# Patient Record
Sex: Male | Born: 1935 | Race: White | Hispanic: No | Marital: Married | Smoking: Never smoker
Health system: Southern US, Community
[De-identification: ages and names within clinical notes are randomized; demographics above are authoritative.]

## PROBLEM LIST (undated history)

## (undated) DIAGNOSIS — R55 Syncope and collapse: Secondary | ICD-10-CM

## (undated) DIAGNOSIS — F0390 Unspecified dementia without behavioral disturbance: Secondary | ICD-10-CM

## (undated) DIAGNOSIS — Z87442 Personal history of urinary calculi: Secondary | ICD-10-CM

## (undated) DIAGNOSIS — C642 Malignant neoplasm of left kidney, except renal pelvis: Secondary | ICD-10-CM

## (undated) DIAGNOSIS — R112 Nausea with vomiting, unspecified: Secondary | ICD-10-CM

## (undated) DIAGNOSIS — N400 Enlarged prostate without lower urinary tract symptoms: Secondary | ICD-10-CM

## (undated) DIAGNOSIS — I499 Cardiac arrhythmia, unspecified: Secondary | ICD-10-CM

## (undated) DIAGNOSIS — Z96659 Presence of unspecified artificial knee joint: Secondary | ICD-10-CM

## (undated) DIAGNOSIS — I10 Essential (primary) hypertension: Secondary | ICD-10-CM

## (undated) DIAGNOSIS — E785 Hyperlipidemia, unspecified: Secondary | ICD-10-CM

## (undated) DIAGNOSIS — F0280 Dementia in other diseases classified elsewhere without behavioral disturbance: Secondary | ICD-10-CM

## (undated) DIAGNOSIS — G3184 Mild cognitive impairment, so stated: Secondary | ICD-10-CM

## (undated) DIAGNOSIS — Z9889 Other specified postprocedural states: Secondary | ICD-10-CM

## (undated) DIAGNOSIS — M1990 Unspecified osteoarthritis, unspecified site: Secondary | ICD-10-CM

## (undated) HISTORY — PX: CHOLECYSTECTOMY: SHX55

## (undated) HISTORY — PX: BOWEL RESECTION: SHX1257

## (undated) HISTORY — PX: COLONOSCOPY: SHX174

## (undated) HISTORY — DX: Cardiac arrhythmia, unspecified: I49.9

## (undated) HISTORY — DX: Hereditary hemochromatosis: E83.110

## (undated) HISTORY — PX: SKIN LESION EXCISION: SHX2412

## (undated) HISTORY — PX: APPENDECTOMY: SHX54

## (undated) HISTORY — DX: Benign prostatic hyperplasia without lower urinary tract symptoms: N40.0

## (undated) HISTORY — PX: OTHER SURGICAL HISTORY: SHX169

## (undated) HISTORY — DX: Dementia in other diseases classified elsewhere, unspecified severity, without behavioral disturbance, psychotic disturbance, mood disturbance, and anxiety: F02.80

## (undated) HISTORY — DX: Hyperlipidemia, unspecified: E78.5

---
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
## 2003-12-23 ENCOUNTER — Ambulatory Visit: Payer: Self-pay | Admitting: Family Medicine

## 2004-03-31 ENCOUNTER — Ambulatory Visit: Payer: Self-pay | Admitting: Family Medicine

## 2004-06-04 ENCOUNTER — Ambulatory Visit: Payer: Self-pay | Admitting: Family Medicine

## 2004-12-01 ENCOUNTER — Ambulatory Visit: Payer: Self-pay | Admitting: Family Medicine

## 2012-10-03 DIAGNOSIS — G3184 Mild cognitive impairment, so stated: Secondary | ICD-10-CM

## 2012-10-03 HISTORY — DX: Mild cognitive impairment of uncertain or unknown etiology: G31.84

## 2015-03-11 DIAGNOSIS — R55 Syncope and collapse: Secondary | ICD-10-CM

## 2015-03-11 DIAGNOSIS — I10 Essential (primary) hypertension: Secondary | ICD-10-CM

## 2015-03-11 HISTORY — DX: Essential (primary) hypertension: I10

## 2015-03-11 HISTORY — DX: Syncope and collapse: R55

## 2015-11-07 ENCOUNTER — Ambulatory Visit (HOSPITAL_COMMUNITY)
Admission: RE | Admit: 2015-11-07 | Discharge: 2015-11-07 | Disposition: A | Discharge status: Home / Self Care | Payer: Medicare HMO | Source: Ambulatory Visit | Attending: Orthopedic Surgery | Admitting: Orthopedic Surgery

## 2015-11-07 ENCOUNTER — Encounter (HOSPITAL_COMMUNITY): Payer: Self-pay

## 2015-11-07 ENCOUNTER — Encounter (HOSPITAL_COMMUNITY)
Admission: RE | Admit: 2015-11-07 | Discharge: 2015-11-07 | Disposition: A | Discharge status: Home / Self Care | Payer: Medicare HMO | Source: Ambulatory Visit | Attending: Orthopedic Surgery | Admitting: Orthopedic Surgery

## 2015-11-07 DIAGNOSIS — M1712 Unilateral primary osteoarthritis, left knee: Secondary | ICD-10-CM | POA: Diagnosis not present

## 2015-11-07 DIAGNOSIS — Z01818 Encounter for other preprocedural examination: Secondary | ICD-10-CM | POA: Insufficient documentation

## 2015-11-07 DIAGNOSIS — R001 Bradycardia, unspecified: Secondary | ICD-10-CM | POA: Insufficient documentation

## 2015-11-07 DIAGNOSIS — Z0183 Encounter for blood typing: Secondary | ICD-10-CM | POA: Diagnosis not present

## 2015-11-07 DIAGNOSIS — R9431 Abnormal electrocardiogram [ECG] [EKG]: Secondary | ICD-10-CM | POA: Diagnosis not present

## 2015-11-07 DIAGNOSIS — Z01812 Encounter for preprocedural laboratory examination: Secondary | ICD-10-CM | POA: Insufficient documentation

## 2015-11-07 HISTORY — DX: Personal history of urinary calculi: Z87.442

## 2015-11-07 HISTORY — DX: Unspecified osteoarthritis, unspecified site: M19.90

## 2015-11-07 HISTORY — DX: Other specified postprocedural states: Z98.890

## 2015-11-07 HISTORY — DX: Unspecified dementia, unspecified severity, without behavioral disturbance, psychotic disturbance, mood disturbance, and anxiety: F03.90

## 2015-11-07 HISTORY — DX: Essential (primary) hypertension: I10

## 2015-11-07 HISTORY — DX: Nausea with vomiting, unspecified: R11.2

## 2015-11-07 LAB — CBC WITH DIFFERENTIAL/PLATELET
BASOS PCT: 1 %
Basophils Absolute: 0.1 10*3/uL (ref 0.0–0.1)
Eosinophils Absolute: 0.3 10*3/uL (ref 0.0–0.7)
Eosinophils Relative: 3 %
HEMATOCRIT: 44.2 % (ref 39.0–52.0)
Hemoglobin: 15 g/dL (ref 13.0–17.0)
Lymphocytes Relative: 20 %
Lymphs Abs: 2 10*3/uL (ref 0.7–4.0)
MCH: 29.6 pg (ref 26.0–34.0)
MCHC: 33.9 g/dL (ref 30.0–36.0)
MCV: 87.4 fL (ref 78.0–100.0)
MONO ABS: 0.7 10*3/uL (ref 0.1–1.0)
MONOS PCT: 7 %
NEUTROS ABS: 6.8 10*3/uL (ref 1.7–7.7)
Neutrophils Relative %: 69 %
Platelets: 269 10*3/uL (ref 150–400)
RBC: 5.06 MIL/uL (ref 4.22–5.81)
RDW: 13.8 % (ref 11.5–15.5)
WBC: 9.9 10*3/uL (ref 4.0–10.5)

## 2015-11-07 LAB — COMPREHENSIVE METABOLIC PANEL
ALBUMIN: 4.4 g/dL (ref 3.5–5.0)
ALT: 39 U/L (ref 17–63)
ANION GAP: 7 (ref 5–15)
AST: 28 U/L (ref 15–41)
Alkaline Phosphatase: 51 U/L (ref 38–126)
BILIRUBIN TOTAL: 0.9 mg/dL (ref 0.3–1.2)
BUN: 12 mg/dL (ref 6–20)
CO2: 26 mmol/L (ref 22–32)
Calcium: 9.3 mg/dL (ref 8.9–10.3)
Chloride: 105 mmol/L (ref 101–111)
Creatinine, Ser: 0.93 mg/dL (ref 0.61–1.24)
GLUCOSE: 104 mg/dL — AB (ref 65–99)
POTASSIUM: 4.4 mmol/L (ref 3.5–5.1)
Sodium: 138 mmol/L (ref 135–145)
TOTAL PROTEIN: 7.2 g/dL (ref 6.5–8.1)

## 2015-11-07 LAB — ABO/RH: ABO/RH(D): A POS

## 2015-11-07 LAB — APTT: aPTT: 30 seconds (ref 24–36)

## 2015-11-07 LAB — SURGICAL PCR SCREEN
MRSA, PCR: NEGATIVE
STAPHYLOCOCCUS AUREUS: NEGATIVE

## 2015-11-07 LAB — TYPE AND SCREEN
ABO/RH(D): A POS
Antibody Screen: NEGATIVE

## 2015-11-07 LAB — PROTIME-INR
INR: 1.04
PROTHROMBIN TIME: 13.6 s (ref 11.4–15.2)

## 2015-11-07 NOTE — H&P (Signed)
CHIEF COMPLAINT:  Painful knee.   HPI:  Javier Hall is as 80 year old white male who has a history of pain in his left knee for the past five or so years. He had a gradual onset of pain starting about five years ago which has gradually worsened over time. He had a previous arthroscopic debridement and meniscectomy in the past. This has been treated conservatively with over-the-counter NSAID's as well as activity modification. He rates his pain as about a 7/10. He has nighttime pain as well as pain with activities of daily living. He has tried Aleve with only mild improvement.  The corticosteroid injections also provided mild improvement. He has not had viscosupplementation. He is seen today for evaluation.  Past medical history: In general his health is good.   Hospitalizations:  1975 for colon resection for diverticulosis.  1980's for laparoscopic cholecystectomy. 2006 for left knee arthroscopic surgery.  2017 for hypertension.   Medications:  Namenda X-RAYS 28 mg daily  Donepezil 100 mg daily.  Lisinopril 10 mg daily. Aspirin 81 mg daily. Fish Oil 100 mg daily. Multi-Vitamin for Men one daily.   Review of systems: The 14 point review of systems reveals glasses and hoarseness. He has had hypertension starting in June 2017.  Prior to that he did not have any symptoms. He is on Lisinopril for that. He has had hemorrhoidal pain but this seems to be under control. Renal calculi noted in 1975. He does have nocturia 2-3 times an evening. 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 He also has some decreased memory. He is on Namenda and Donepezil.   Family History:   Mother deceased in her 53's; she had hypertension and breast cancer. Father died at 75 from an aneurysm. He did have hypertension and diabetes. Brothers age 6 and 35, one with prostate cancer. Two sisters ages 79 and 92, history of breast cancer with them.  Social  History: Mr. Disabatino is a 80 year old married male who is retired. Denies  history of tobacco or alcohol.   EXAMINATION: In general his health is good.  HT: 5\' 11"   WT: 188.7   P: 64   BP: 165/65 Oxygen Sat:  96%. Head: Normocephalic.  Eyes: PERRLA with Extraocular motion is intact. Ears, nose and throat:  Benign.  Neck:  Supple, no bruits noted.  Chest: Good expansion.  Lungs:  Essentially clear.  Cardiac:  He had a regular rhythm and rate. Normal S1 and S2. No murmur was appreciated.  Pulses:  2+ bilateral, symmetric in lower extremities.  Abdomen:  Scaphoid, soft and nontender. No masses palpable. Normal bowel sounds present. CNS: Oriented times three. Cranial nerves II-XII grossly intact.  Genital, rectal and breast exam: Not indicated for an orthopedic evaluation. Musculoskeletal:  Today, he has a range of motion of 5-115 degrees. He has marked crepitus with range of motion about the joint. Trace effusion. He does have some pseudolaxity with valgus stressing but has a good endpoint.   X-RAYS: Radiographic studies show a significant varus with good bone quality. He has severe degenerative joint disease with loss of the medial compartment.   IMPRESSION:  1. Endstage osteoarthritis left knee.  2. History of hypertension. 3. Prostatic hypertrophy.  4. Mild memory loss.  PLAN:  At this time, my plan would be to proceed with a left total knee arthroplasty.  At this time the patient history, physical exam and imaging studies are consistent with progressive degenerative changes of the left knee. He has failed conservative treatment.  The clearance notes were reviewed. After discussion with the patient it was felt a total knee arthroplasty was indicated. The procedure, risks and benefits of the total knee arthroplasty were presented and reviewed. The risks include but are not limited to aseptic loosening, infection, blood clots, vascular and nerve injury, stiffness, patella tracking problems and fracture complications among others discussed.  The patient  acknowledged the explanation and agreed to proceed with a total knee replacement on the left.    Javier Craze Saanya Zieske, PA-C  11/07/2015 12:12 PM

## 2015-11-07 NOTE — Progress Notes (Signed)
PCP is Dr Jenean Lindau- clearance noted on chart from Dr. Kurtis Bushman he saw a Heart dr while in the hospital in [place], but doesn't remember his name. His wife states his blood pressure went up and he spent 2 days in the hospital in [place].  Denies any chest pain. Denies ever having a card cath. Not sure,but he thinks he had a stress test and echo while in the hospital -request sent for any heart studies done in the hospital.  Care everywhere has report of Halter monitor.

## 2015-11-07 NOTE — Pre-Procedure Instructions (Signed)
Javier Hall  11/07/2015      [facility] - [address] [place] [address] Phone: [phone] Fax: [phone]    Your procedure is scheduled on Nov 8.  Report to [facility] Admitting at [phone] A.M.  Call this number if you have problems the morning of surgery:  [phone]   Remember:  Do not eat food or drink liquids after midnight.  Take these medicines the morning of surgery with A SIP OF WATER Donepezil (Aricept), Namenda XR  Stop taking aspirin, Ibuprofen, Advil, Motrin, BC's, Goody's, Herbal medications, Fish Oil, Aleve   Do not wear jewelry, make-up or nail polish.  Do not wear lotions, powders, or perfumes, or deoderant.  Do not shave 48 hours prior to surgery.  Men may shave face and neck.  Do not bring valuables to the hospital.  [facility] is not responsible for any belongings or valuables.  Contacts, dentures or bridgework may not be worn into surgery.  Leave your suitcase in the car.  After surgery it may be brought to your room.  For patients admitted to the hospital, discharge time will be determined by your treatment team.  Patients discharged the day of surgery will not be allowed to drive home.    Special instructions:  [place] - Preparing for Surgery  Before surgery, you can play an important role.  Because skin is not sterile, your skin needs to be as free of germs as possible.  You can reduce the number of germs on you skin by washing with CHG (chlorahexidine gluconate) soap before surgery.  CHG is an antiseptic cleaner which kills germs and bonds with the skin to continue killing germs even after washing.  Please DO NOT use if you have an allergy to CHG or antibacterial soaps.  If your skin becomes reddened/irritated stop using the CHG and inform your nurse when you arrive at Short Stay.  Do not shave (including legs and  underarms) for at least 48 hours prior to the first CHG shower.  You may shave your face.  Please follow these instructions carefully:   1.  Shower with CHG Soap the night before surgery and the    morning of Surgery.  2.  If you choose to wash your hair, wash your hair first as usual with your       normal shampoo.  3.  After you shampoo, rinse your hair and body thoroughly to remove the                      Shampoo.  4.  Use CHG as you would any other liquid soap.  You can apply chg directly       to the skin and wash gently with scrungie or a clean washcloth.  5.  Apply the CHG Soap to your body ONLY FROM THE NECK DOWN.        Do not use on open wounds or open sores.  Avoid contact with your eyes,       ears, mouth and genitals (private parts).  Wash genitals (private parts)       with your normal soap.  6.  Wash thoroughly, paying special attention to the area where your surgery        will be performed.  7.  Thoroughly rinse your body with warm water from the  neck down.  8.  DO NOT shower/wash with your normal soap after using and rinsing off       the CHG Soap.  9.  Pat yourself dry with a clean towel.            10.  Wear clean pajamas.            11.  Place clean sheets on your bed the night of your first shower and do not        sleep with pets.  Day of Surgery  Do not apply any lotions/deoderants the morning of surgery.  Please wear clean clothes to the hospital/surgery center.     Please read over the following fact sheets that you were given. Pain Booklet, Coughing and Deep Breathing, MRSA Information and Surgical Site Infection Prevention

## 2015-11-18 MED ORDER — TRANEXAMIC ACID 1000 MG/10ML IV SOLN
1000.0000 mg | INTRAVENOUS | Status: AC
  Administered 2015-11-19: 1000 mg via INTRAVENOUS
  Filled 2015-11-18: qty 10

## 2015-11-19 ENCOUNTER — Encounter (HOSPITAL_COMMUNITY): Payer: Self-pay | Admitting: Anesthesiology

## 2015-11-19 ENCOUNTER — Inpatient Hospital Stay (HOSPITAL_COMMUNITY): Payer: Medicare HMO | Admitting: Anesthesiology

## 2015-11-19 ENCOUNTER — Inpatient Hospital Stay (HOSPITAL_COMMUNITY): Payer: Medicare HMO

## 2015-11-19 ENCOUNTER — Inpatient Hospital Stay (HOSPITAL_COMMUNITY)
Admission: RE | Admit: 2015-11-19 | Discharge: 2015-11-21 | DRG: 470 | Disposition: A | Discharge status: Home Health | Payer: Medicare HMO | Source: Ambulatory Visit | Attending: Orthopedic Surgery | Admitting: Orthopedic Surgery

## 2015-11-19 ENCOUNTER — Encounter (HOSPITAL_COMMUNITY)
Admission: RE | Disposition: A | Discharge status: Home Health | Payer: Self-pay | Source: Ambulatory Visit | Attending: Orthopedic Surgery

## 2015-11-19 DIAGNOSIS — M25562 Pain in left knee: Secondary | ICD-10-CM | POA: Diagnosis present

## 2015-11-19 DIAGNOSIS — R262 Difficulty in walking, not elsewhere classified: Secondary | ICD-10-CM

## 2015-11-19 DIAGNOSIS — Z7982 Long term (current) use of aspirin: Secondary | ICD-10-CM | POA: Diagnosis not present

## 2015-11-19 DIAGNOSIS — Z96659 Presence of unspecified artificial knee joint: Secondary | ICD-10-CM

## 2015-11-19 DIAGNOSIS — F0390 Unspecified dementia without behavioral disturbance: Secondary | ICD-10-CM | POA: Diagnosis present

## 2015-11-19 DIAGNOSIS — I10 Essential (primary) hypertension: Secondary | ICD-10-CM | POA: Diagnosis present

## 2015-11-19 DIAGNOSIS — M25662 Stiffness of left knee, not elsewhere classified: Secondary | ICD-10-CM

## 2015-11-19 DIAGNOSIS — Z79899 Other long term (current) drug therapy: Secondary | ICD-10-CM

## 2015-11-19 DIAGNOSIS — M24562 Contracture, left knee: Secondary | ICD-10-CM | POA: Diagnosis not present

## 2015-11-19 DIAGNOSIS — N400 Enlarged prostate without lower urinary tract symptoms: Secondary | ICD-10-CM | POA: Diagnosis not present

## 2015-11-19 DIAGNOSIS — M1712 Unilateral primary osteoarthritis, left knee: Secondary | ICD-10-CM | POA: Diagnosis not present

## 2015-11-19 HISTORY — PX: TOTAL KNEE ARTHROPLASTY: SHX125

## 2015-11-19 HISTORY — DX: Presence of unspecified artificial knee joint: Z96.659

## 2015-11-19 SURGERY — ARTHROPLASTY, KNEE, TOTAL
Anesthesia: Monitor Anesthesia Care | Laterality: Left

## 2015-11-19 MED ORDER — HYDROMORPHONE HCL 2 MG/ML IJ SOLN: 0.5000 mg | INTRAMUSCULAR | Status: DC | PRN

## 2015-11-19 MED ORDER — CHLORHEXIDINE GLUCONATE 4 % EX LIQD: 60.0000 mL | Freq: Once | CUTANEOUS | Status: DC

## 2015-11-19 MED ORDER — FENTANYL CITRATE (PF) 100 MCG/2ML IJ SOLN
INTRAMUSCULAR | Status: AC
  Administered 2015-11-19: 25 ug via INTRAVENOUS
  Filled 2015-11-19: qty 2

## 2015-11-19 MED ORDER — ONDANSETRON HCL 4 MG/2ML IJ SOLN
INTRAMUSCULAR | Status: DC | PRN
  Administered 2015-11-19: 4 mg via INTRAVENOUS

## 2015-11-19 MED ORDER — SODIUM CHLORIDE 0.9 % IR SOLN
Status: DC | PRN
  Administered 2015-11-19: 3000 mL

## 2015-11-19 MED ORDER — BUPIVACAINE HCL 0.5 % IJ SOLN
INTRAMUSCULAR | Status: DC | PRN
  Administered 2015-11-19: 10 mL

## 2015-11-19 MED ORDER — ASPIRIN EC 325 MG PO TBEC: 325.0000 mg | DELAYED_RELEASE_TABLET | Freq: Every day | ORAL | 0 refills | Status: DC

## 2015-11-19 MED ORDER — ACETAMINOPHEN 10 MG/ML IV SOLN
INTRAVENOUS | Status: AC
  Filled 2015-11-19: qty 100

## 2015-11-19 MED ORDER — EPHEDRINE SULFATE 50 MG/ML IJ SOLN
INTRAMUSCULAR | Status: DC | PRN
  Administered 2015-11-19: 5 mg via INTRAVENOUS
  Administered 2015-11-19: 10 mg via INTRAVENOUS
  Administered 2015-11-19: 5 mg via INTRAVENOUS
  Administered 2015-11-19: 10 mg via INTRAVENOUS

## 2015-11-19 MED ORDER — ONDANSETRON HCL 4 MG PO TABS
4.0000 mg | ORAL_TABLET | Freq: Four times a day (QID) | ORAL | Status: DC | PRN
  Administered 2015-11-20: 4 mg via ORAL
  Filled 2015-11-19: qty 1

## 2015-11-19 MED ORDER — DOCUSATE SODIUM 100 MG PO CAPS
100.0000 mg | ORAL_CAPSULE | Freq: Two times a day (BID) | ORAL | Status: DC
  Administered 2015-11-19 – 2015-11-21 (×4): 100 mg via ORAL
  Filled 2015-11-19 (×4): qty 1

## 2015-11-19 MED ORDER — DIAZEPAM 2 MG PO TABS: 2.0000 mg | ORAL_TABLET | Freq: Three times a day (TID) | ORAL | 0 refills | Status: DC | PRN

## 2015-11-19 MED ORDER — SODIUM CHLORIDE 0.9 % IV SOLN: INTRAVENOUS | Status: DC

## 2015-11-19 MED ORDER — PROPOFOL 1000 MG/100ML IV EMUL
INTRAVENOUS | Status: AC
Start: 2015-11-19 — End: 2015-11-19
  Filled 2015-11-19: qty 100

## 2015-11-19 MED ORDER — ACETAMINOPHEN 325 MG PO TABS: 650.0000 mg | ORAL_TABLET | Freq: Four times a day (QID) | ORAL | Status: DC | PRN

## 2015-11-19 MED ORDER — DIPHENHYDRAMINE HCL 12.5 MG/5ML PO ELIX: 12.5000 mg | ORAL_SOLUTION | ORAL | Status: DC | PRN

## 2015-11-19 MED ORDER — DONEPEZIL HCL 10 MG PO TABS
10.0000 mg | ORAL_TABLET | Freq: Every day | ORAL | Status: DC
Start: 2015-11-20 — End: 2015-11-21
  Administered 2015-11-20 – 2015-11-21 (×2): 10 mg via ORAL
  Filled 2015-11-19 (×2): qty 1

## 2015-11-19 MED ORDER — LIDOCAINE 2% (20 MG/ML) 5 ML SYRINGE
INTRAMUSCULAR | Status: AC
  Filled 2015-11-19: qty 5

## 2015-11-19 MED ORDER — BISACODYL 5 MG PO TBEC: 5.0000 mg | DELAYED_RELEASE_TABLET | Freq: Every day | ORAL | Status: DC | PRN

## 2015-11-19 MED ORDER — ACETAMINOPHEN 650 MG RE SUPP: 650.0000 mg | Freq: Four times a day (QID) | RECTAL | Status: DC | PRN

## 2015-11-19 MED ORDER — PROPOFOL 500 MG/50ML IV EMUL
INTRAVENOUS | Status: AC
  Filled 2015-11-19: qty 50

## 2015-11-19 MED ORDER — CELECOXIB 200 MG PO CAPS
200.0000 mg | ORAL_CAPSULE | Freq: Two times a day (BID) | ORAL | Status: DC
  Administered 2015-11-19 – 2015-11-21 (×4): 200 mg via ORAL
  Filled 2015-11-19 (×4): qty 1

## 2015-11-19 MED ORDER — MEMANTINE HCL ER 28 MG PO CP24
28.0000 mg | ORAL_CAPSULE | Freq: Every day | ORAL | Status: DC
Start: 2015-11-20 — End: 2015-11-21
  Administered 2015-11-20 – 2015-11-21 (×2): 28 mg via ORAL
  Filled 2015-11-19 (×2): qty 1

## 2015-11-19 MED ORDER — POTASSIUM CHLORIDE IN NACL 20-0.9 MEQ/L-% IV SOLN
INTRAVENOUS | Status: DC
  Administered 2015-11-19 – 2015-11-20 (×2): via INTRAVENOUS
  Filled 2015-11-19 (×2): qty 1000

## 2015-11-19 MED ORDER — BUPIVACAINE LIPOSOME 1.3 % IJ SUSP
INTRAMUSCULAR | Status: DC | PRN
  Administered 2015-11-19: 20 mL

## 2015-11-19 MED ORDER — ASPIRIN EC 325 MG PO TBEC
325.0000 mg | DELAYED_RELEASE_TABLET | Freq: Every day | ORAL | Status: DC
  Administered 2015-11-20 – 2015-11-21 (×2): 325 mg via ORAL
  Filled 2015-11-19 (×2): qty 1

## 2015-11-19 MED ORDER — HYDROCODONE-ACETAMINOPHEN 7.5-325 MG PO TABS: 1.0000 | ORAL_TABLET | ORAL | 0 refills | Status: DC | PRN

## 2015-11-19 MED ORDER — ACETAMINOPHEN 10 MG/ML IV SOLN
INTRAVENOUS | Status: DC | PRN
  Administered 2015-11-19: 1000 mg via INTRAVENOUS

## 2015-11-19 MED ORDER — PROPOFOL 500 MG/50ML IV EMUL
INTRAVENOUS | Status: DC | PRN
  Administered 2015-11-19: 50 ug/kg/min via INTRAVENOUS

## 2015-11-19 MED ORDER — FENTANYL CITRATE (PF) 100 MCG/2ML IJ SOLN
INTRAMUSCULAR | Status: AC
  Filled 2015-11-19: qty 2

## 2015-11-19 MED ORDER — LACTATED RINGERS IV SOLN
INTRAVENOUS | Status: DC
Start: 2015-11-19 — End: 2015-11-19
  Administered 2015-11-19 (×3): via INTRAVENOUS

## 2015-11-19 MED ORDER — ONDANSETRON HCL 4 MG PO TABS: 4.0000 mg | ORAL_TABLET | Freq: Three times a day (TID) | ORAL | 0 refills | Status: DC | PRN

## 2015-11-19 MED ORDER — LISINOPRIL 10 MG PO TABS
10.0000 mg | ORAL_TABLET | Freq: Every day | ORAL | Status: DC
  Administered 2015-11-20 – 2015-11-21 (×2): 10 mg via ORAL
  Filled 2015-11-19 (×2): qty 1

## 2015-11-19 MED ORDER — PHENYLEPHRINE 40 MCG/ML (10ML) SYRINGE FOR IV PUSH (FOR BLOOD PRESSURE SUPPORT)
PREFILLED_SYRINGE | INTRAVENOUS | Status: AC
  Filled 2015-11-19: qty 10

## 2015-11-19 MED ORDER — OXYCODONE HCL 5 MG PO TABS
5.0000 mg | ORAL_TABLET | ORAL | Status: DC | PRN
  Administered 2015-11-19 – 2015-11-20 (×5): 10 mg via ORAL
  Administered 2015-11-21: 5 mg via ORAL
  Filled 2015-11-19 (×4): qty 2
  Filled 2015-11-19: qty 1
  Filled 2015-11-19: qty 2

## 2015-11-19 MED ORDER — FENTANYL CITRATE (PF) 100 MCG/2ML IJ SOLN
INTRAMUSCULAR | Status: DC | PRN
  Administered 2015-11-19: 50 ug via INTRAVENOUS

## 2015-11-19 MED ORDER — ZOLPIDEM TARTRATE 5 MG PO TABS: 5.0000 mg | ORAL_TABLET | Freq: Every evening | ORAL | Status: DC | PRN

## 2015-11-19 MED ORDER — PHENOL 1.4 % MT LIQD: 1.0000 | OROMUCOSAL | Status: DC | PRN

## 2015-11-19 MED ORDER — MAGNESIUM CITRATE PO SOLN: 1.0000 | Freq: Once | ORAL | Status: DC | PRN

## 2015-11-19 MED ORDER — CEFAZOLIN SODIUM-DEXTROSE 2-4 GM/100ML-% IV SOLN
INTRAVENOUS | Status: AC
  Filled 2015-11-19: qty 100

## 2015-11-19 MED ORDER — FENTANYL CITRATE (PF) 100 MCG/2ML IJ SOLN
25.0000 ug | INTRAMUSCULAR | Status: DC | PRN
  Administered 2015-11-19 (×2): 25 ug via INTRAVENOUS

## 2015-11-19 MED ORDER — ONDANSETRON HCL 4 MG/2ML IJ SOLN
4.0000 mg | Freq: Four times a day (QID) | INTRAMUSCULAR | Status: DC | PRN
  Administered 2015-11-20 – 2015-11-21 (×2): 4 mg via INTRAVENOUS
  Filled 2015-11-19 (×2): qty 2

## 2015-11-19 MED ORDER — OXYCODONE HCL 5 MG PO TABS
ORAL_TABLET | ORAL | Status: AC
  Filled 2015-11-19: qty 1

## 2015-11-19 MED ORDER — MEPERIDINE HCL 25 MG/ML IJ SOLN: 6.2500 mg | INTRAMUSCULAR | Status: DC | PRN

## 2015-11-19 MED ORDER — PHENYLEPHRINE HCL 10 MG/ML IJ SOLN
INTRAMUSCULAR | Status: DC | PRN
  Administered 2015-11-19 (×2): 80 ug via INTRAVENOUS

## 2015-11-19 MED ORDER — BUPIVACAINE HCL (PF) 0.5 % IJ SOLN
INTRAMUSCULAR | Status: AC
  Filled 2015-11-19: qty 30

## 2015-11-19 MED ORDER — BUPIVACAINE IN DEXTROSE 0.75-8.25 % IT SOLN
INTRATHECAL | Status: DC | PRN
  Administered 2015-11-19: 1.6 mL via INTRATHECAL

## 2015-11-19 MED ORDER — CEFAZOLIN SODIUM-DEXTROSE 2-4 GM/100ML-% IV SOLN
2.0000 g | Freq: Four times a day (QID) | INTRAVENOUS | Status: AC
  Administered 2015-11-19 (×2): 2 g via INTRAVENOUS
  Filled 2015-11-19 (×2): qty 100

## 2015-11-19 MED ORDER — BUPIVACAINE LIPOSOME 1.3 % IJ SUSP
20.0000 mL | Freq: Once | INTRAMUSCULAR | Status: DC
  Filled 2015-11-19: qty 20

## 2015-11-19 MED ORDER — METOCLOPRAMIDE HCL 5 MG PO TABS: 5.0000 mg | ORAL_TABLET | Freq: Three times a day (TID) | ORAL | Status: DC | PRN

## 2015-11-19 MED ORDER — PROPOFOL 10 MG/ML IV BOLUS
INTRAVENOUS | Status: DC | PRN
  Administered 2015-11-19: 30 mg via INTRAVENOUS

## 2015-11-19 MED ORDER — SODIUM CHLORIDE 0.9 % IJ SOLN
INTRAMUSCULAR | Status: DC | PRN
  Administered 2015-11-19: 40 mL via INTRAVENOUS

## 2015-11-19 MED ORDER — ALUM & MAG HYDROXIDE-SIMETH 200-200-20 MG/5ML PO SUSP: 30.0000 mL | ORAL | Status: DC | PRN

## 2015-11-19 MED ORDER — METOCLOPRAMIDE HCL 5 MG/ML IJ SOLN: 10.0000 mg | Freq: Once | INTRAMUSCULAR | Status: DC | PRN

## 2015-11-19 MED ORDER — HYDROMORPHONE HCL 1 MG/ML IJ SOLN: 0.5000 mg | INTRAMUSCULAR | Status: DC | PRN

## 2015-11-19 MED ORDER — EPHEDRINE 5 MG/ML INJ
INTRAVENOUS | Status: AC
  Filled 2015-11-19: qty 10

## 2015-11-19 MED ORDER — DEXAMETHASONE SODIUM PHOSPHATE 10 MG/ML IJ SOLN
10.0000 mg | Freq: Once | INTRAMUSCULAR | Status: AC
  Administered 2015-11-20: 10 mg via INTRAVENOUS
  Filled 2015-11-19: qty 1

## 2015-11-19 MED ORDER — METOCLOPRAMIDE HCL 5 MG/ML IJ SOLN: 5.0000 mg | Freq: Three times a day (TID) | INTRAMUSCULAR | Status: DC | PRN

## 2015-11-19 MED ORDER — CEFAZOLIN SODIUM-DEXTROSE 2-4 GM/100ML-% IV SOLN
2.0000 g | INTRAVENOUS | Status: AC
  Administered 2015-11-19: 2 g via INTRAVENOUS

## 2015-11-19 MED ORDER — MENTHOL 3 MG MT LOZG: 1.0000 | LOZENGE | OROMUCOSAL | Status: DC | PRN

## 2015-11-19 MED ORDER — POLYETHYLENE GLYCOL 3350 17 G PO PACK: 17.0000 g | PACK | Freq: Every day | ORAL | Status: DC | PRN

## 2015-11-19 SURGICAL SUPPLY — 72 items
APL SKNCLS STERI-STRIP NONHPOA (GAUZE/BANDAGES/DRESSINGS) ×1
BANDAGE ACE 4X5 VEL STRL LF (GAUZE/BANDAGES/DRESSINGS) ×3 IMPLANT
BANDAGE ACE 6X5 VEL STRL LF (GAUZE/BANDAGES/DRESSINGS) ×3 IMPLANT
BANDAGE ESMARK 6X9 LF (GAUZE/BANDAGES/DRESSINGS) ×1 IMPLANT
BENZOIN TINCTURE PRP APPL 2/3 (GAUZE/BANDAGES/DRESSINGS) ×3 IMPLANT
BLADE SAG 18X100X1.27 (BLADE) ×6 IMPLANT
BNDG CMPR 9X6 STRL LF SNTH (GAUZE/BANDAGES/DRESSINGS) ×1
BNDG ESMARK 6X9 LF (GAUZE/BANDAGES/DRESSINGS) ×3
BOWL SMART MIX CTS (DISPOSABLE) ×3 IMPLANT
CAPT KNEE TOTAL 3 ×2 IMPLANT
CATH FOLEY 2WAY  5CC 16FR SIL (CATHETERS) ×2
CATH FOLEY 2WAY 5CC 16FR SIL (CATHETERS) ×1 IMPLANT
CEMENT BONE SIMPLEX SPEEDSET (Cement) ×6 IMPLANT
CLOSURE WOUND 1/2 X4 (GAUZE/BANDAGES/DRESSINGS) ×2
COVER SURGICAL LIGHT HANDLE (MISCELLANEOUS) ×3 IMPLANT
CUFF TOURNIQUET SINGLE 34IN LL (TOURNIQUET CUFF) ×3 IMPLANT
DRAPE HALF SHEET 40X57 (DRAPES) ×3 IMPLANT
DRAPE IMP U-DRAPE 54X76 (DRAPES) ×3 IMPLANT
DRAPE PROXIMA HALF (DRAPES) ×3 IMPLANT
DRAPE U-SHAPE 47X51 STRL (DRAPES) ×3 IMPLANT
DRSG AQUACEL AG ADV 3.5X10 (GAUZE/BANDAGES/DRESSINGS) ×3 IMPLANT
DRSG PAD ABDOMINAL 8X10 ST (GAUZE/BANDAGES/DRESSINGS) ×3 IMPLANT
DURAPREP 26ML APPLICATOR (WOUND CARE) ×6 IMPLANT
ELECT CAUTERY BLADE 6.4 (BLADE) ×3 IMPLANT
ELECT REM PT RETURN 9FT ADLT (ELECTROSURGICAL) ×3
ELECTRODE REM PT RTRN 9FT ADLT (ELECTROSURGICAL) ×1 IMPLANT
EVACUATOR 1/8 PVC DRAIN (DRAIN) IMPLANT
FACESHIELD WRAPAROUND (MASK) ×6 IMPLANT
FACESHIELD WRAPAROUND OR TEAM (MASK) ×2 IMPLANT
GAUZE SPONGE 4X4 12PLY STRL (GAUZE/BANDAGES/DRESSINGS) ×3 IMPLANT
GLOVE BIOGEL PI IND STRL 7.0 (GLOVE) ×1 IMPLANT
GLOVE BIOGEL PI INDICATOR 7.0 (GLOVE) ×2
GLOVE ORTHO TXT STRL SZ7.5 (GLOVE) ×3 IMPLANT
GLOVE SURG ORTHO 7.0 STRL STRW (GLOVE) ×3 IMPLANT
GOWN STRL REUS W/ TWL LRG LVL3 (GOWN DISPOSABLE) ×2 IMPLANT
GOWN STRL REUS W/ TWL XL LVL3 (GOWN DISPOSABLE) ×1 IMPLANT
GOWN STRL REUS W/TWL LRG LVL3 (GOWN DISPOSABLE) ×6
GOWN STRL REUS W/TWL XL LVL3 (GOWN DISPOSABLE) ×3
HANDPIECE INTERPULSE COAX TIP (DISPOSABLE) ×3
IMMOBILIZER KNEE 22 UNIV (SOFTGOODS) ×3 IMPLANT
IMMOBILIZER KNEE 24 THIGH 36 (MISCELLANEOUS) IMPLANT
IMMOBILIZER KNEE 24 UNIV (MISCELLANEOUS)
KIT BASIN OR (CUSTOM PROCEDURE TRAY) ×3 IMPLANT
KIT ROOM TURNOVER OR (KITS) ×3 IMPLANT
MANIFOLD NEPTUNE II (INSTRUMENTS) ×3 IMPLANT
NEEDLE 18GX1X1/2 (RX/OR ONLY) (NEEDLE) ×3 IMPLANT
NEEDLE HYPO 25GX1X1/2 BEV (NEEDLE) ×3 IMPLANT
NS IRRIG 1000ML POUR BTL (IV SOLUTION) ×3 IMPLANT
PACK TOTAL JOINT (CUSTOM PROCEDURE TRAY) ×3 IMPLANT
PACK UNIVERSAL I (CUSTOM PROCEDURE TRAY) ×3 IMPLANT
PAD ARMBOARD 7.5X6 YLW CONV (MISCELLANEOUS) ×6 IMPLANT
PAD CAST 4YDX4 CTTN HI CHSV (CAST SUPPLIES) IMPLANT
PADDING CAST COTTON 4X4 STRL (CAST SUPPLIES) ×3
SET HNDPC FAN SPRY TIP SCT (DISPOSABLE) ×1 IMPLANT
STRIP CLOSURE SKIN 1/2X4 (GAUZE/BANDAGES/DRESSINGS) ×4 IMPLANT
SUCTION FRAZIER HANDLE 10FR (MISCELLANEOUS) ×2
SUCTION TUBE FRAZIER 10FR DISP (MISCELLANEOUS) ×1 IMPLANT
SUT MNCRL AB 4-0 PS2 18 (SUTURE) ×3 IMPLANT
SUT VIC AB 0 CT1 27 (SUTURE)
SUT VIC AB 0 CT1 27XBRD ANBCTR (SUTURE) IMPLANT
SUT VIC AB 1 CTX 27 (SUTURE) ×3 IMPLANT
SUT VIC AB 1 CTX 36 (SUTURE) ×6
SUT VIC AB 1 CTX36XBRD ANBCTR (SUTURE) ×2 IMPLANT
SUT VIC AB 2-0 CT1 27 (SUTURE) ×6
SUT VIC AB 2-0 CT1 TAPERPNT 27 (SUTURE) ×2 IMPLANT
SYR 50ML LL SCALE MARK (SYRINGE) ×3 IMPLANT
SYR CONTROL 10ML LL (SYRINGE) ×3 IMPLANT
TOWEL OR 17X24 6PK STRL BLUE (TOWEL DISPOSABLE) ×3 IMPLANT
TOWEL OR 17X26 10 PK STRL BLUE (TOWEL DISPOSABLE) ×3 IMPLANT
TRAY CATH 16FR W/PLASTIC CATH (SET/KITS/TRAYS/PACK) IMPLANT
WATER STERILE IRR 1000ML POUR (IV SOLUTION) ×3 IMPLANT
YANKAUER SUCT BULB TIP NO VENT (SUCTIONS) ×3 IMPLANT

## 2015-11-19 NOTE — Anesthesia Postprocedure Evaluation (Signed)
Anesthesia Post Note  Patient: Javier Hall  Procedure(s) Performed: Procedure(s) (LRB): TOTAL KNEE ARTHROPLASTY (Left)  Patient location during evaluation: PACU Anesthesia Type: MAC and Spinal Level of consciousness: awake Pain management: pain level controlled Vital Signs Assessment: post-procedure vital signs reviewed and stable Respiratory status: spontaneous breathing Cardiovascular status: stable Postop Assessment: spinal receding and no signs of nausea or vomiting Anesthetic complications: no    Last Vitals:  Vitals:   11/19/15 1654 11/19/15 1719  BP: (!) 146/75 (!) 149/63  Pulse: 74 74  Resp: 12 14  Temp:  36.4 C    Last Pain:  Vitals:   11/19/15 1732  TempSrc:   PainSc: 4                  Joziah Dollins

## 2015-11-19 NOTE — Progress Notes (Signed)
Orthopedic Tech Progress Note Patient Details:  Javier Hall Apr 13, 1935 [id number]  CPM Left Knee CPM Left Knee: On Left Knee Flexion (Degrees): 90 Left Knee Extension (Degrees): 0 Additional Comments: trapeze bar patient helper   Hildred Priest 11/19/2015, 4:20 PM Viewed order from doctor's order list

## 2015-11-19 NOTE — Discharge Summary (Addendum)
Patient ID: Javier Hall MRN: [id number] DOB/AGE: [birth year] 80 y.o.  Admit date: 11/19/2015 Discharge date: 11/21/2015  Admission Diagnoses:  Active Problems:   S/P total knee replacement   Discharge Diagnoses:  Same  Past Medical History:  Diagnosis Date  . Arthritis   . Dementia    mild  . History of kidney stones   . Hypertension   . PONV (postoperative nausea and vomiting)     Surgeries: Procedure(s): TOTAL KNEE ARTHROPLASTY on 11/19/2015   Consultants:   Discharged Condition: Improved  Hospital Course: ENNIS WITKOWSKI is an 80 y.o. male who was admitted 11/19/2015 for operative treatment of primary localized osteoarthritis left knee. Patient has severe unremitting pain that affects sleep, daily activities, and work/hobbies. After pre-op clearance the patient was taken to the operating room on 11/19/2015 and underwent  Procedure(s): TOTAL KNEE ARTHROPLASTY.    Patient was given perioperative antibiotics:  Anti-infectives    Start     Dose/Rate Route Frequency Ordered Stop   11/19/15 1715  ceFAZolin (ANCEF) IVPB 2g/100 mL premix     2 g 200 mL/hr over 30 Minutes Intravenous Every 6 hours 11/19/15 1709 11/19/15 2344   11/19/15 1230  ceFAZolin (ANCEF) IVPB 2g/100 mL premix     2 g 200 mL/hr over 30 Minutes Intravenous On call to O.R. 11/19/15 1031 11/19/15 1344   11/19/15 1032  ceFAZolin (ANCEF) 2-4 GM/100ML-% IVPB    Comments:  Merryl Hacker   : cabinet override      11/19/15 1032 11/19/15 1344       Patient was given sequential compression devices, early ambulation, and chemoprophylaxis to prevent DVT.  Patient benefited maximally from hospital stay and there were no complications.    Recent vital signs:  Patient Vitals for the past 24 hrs:  BP Temp Temp src Pulse Resp SpO2  11/21/15 0516 (!) 157/62 97.6 F (36.4 C) Oral 70 18 95 %  11/20/15 2033 (!) 156/65 98.1 F (36.7 C) Oral 93 18 96 %  11/20/15 1415 (!) 153/61 97.8 F (36.6 C) Oral 64 - 95 %      Recent laboratory studies:   Recent Labs  11/20/15 0540 11/21/15 0217  WBC 12.8* 23.7*  HGB 12.3* 12.7*  HCT 36.6* 36.8*  PLT 194 215  NA 136 136  K 4.2 4.6  CL 107 105  CO2 24 24  BUN 15 14  CREATININE 1.00 1.08  GLUCOSE 159* 152*  CALCIUM 8.1* 8.8*     Discharge Medications:     Medication List    STOP taking these medications   Fish Oil 1000 MG Caps   ibuprofen 200 MG tablet Commonly known as:  ADVIL,MOTRIN     TAKE these medications   aspirin EC 325 MG tablet Take 1 tablet (325 mg total) by mouth daily. 1 tab a day for the next 30 days to prevent blood clots What changed:  medication strength  how much to take  additional instructions   diazepam 2 MG tablet Commonly known as:  VALIUM Take 1 tablet (2 mg total) by mouth every 8 (eight) hours as needed for muscle spasms.   diphenhydrAMINE 25 MG tablet Commonly known as:  BENADRYL Take 25 mg by mouth 2 (two) times daily as needed for allergies.   donepezil 10 MG tablet Commonly known as:  ARICEPT Take 10 mg by mouth daily.   HYDROcodone-acetaminophen 7.5-325 MG tablet Commonly known as:  NORCO Take 1-2 tablets by mouth every 4 (four) hours as needed for  moderate pain.   lisinopril 10 MG tablet Commonly known as:  PRINIVIL,ZESTRIL Take 10 mg by mouth daily.   multivitamin with minerals Tabs tablet Take 1 tablet by mouth daily.   NAMENDA XR 28 MG Cp24 24 hr capsule Generic drug:  memantine Take 28 mg by mouth daily.   ondansetron 4 MG tablet Commonly known as:  ZOFRAN Take 1 tablet (4 mg total) by mouth every 8 (eight) hours as needed for nausea or vomiting.       Diagnostic Studies: Dg Chest 2 View  Result Date: 11/07/2015 CLINICAL DATA:  Preop for knee surgery. EXAM: CHEST  2 VIEW COMPARISON:  Radiograph of February 18, 2015. FINDINGS: The heart size and mediastinal contours are within normal limits. Both lungs are clear. No pneumothorax or pleural effusion is noted. The  visualized skeletal structures are unremarkable. IMPRESSION: No active cardiopulmonary disease. Electronically Signed   By: Marijo Conception, M.D.   On: 11/07/2015 14:41   Dg Knee Left Port  Result Date: 11/19/2015 CLINICAL DATA:  Total knee replacement EXAM: PORTABLE LEFT KNEE - 1-2 VIEW COMPARISON:  None. FINDINGS: Postop total knee replacement. Normal alignment. No fracture or immediate complication. IMPRESSION: Satisfactory total knee replacement. Electronically Signed   By: Franchot Gallo M.D.   On: 11/19/2015 16:18    Disposition: Final discharge disposition not confirmed    Follow-up Information    Ninetta Lights, MD. Schedule an appointment as soon as possible for a visit in 2 week(s).   Specialty:  Orthopedic Surgery Contact information: [address] [address] [phone]            Signed: Fannie Knee 11/21/2015, 10:07 AM

## 2015-11-19 NOTE — Transfer of Care (Signed)
Immediate Anesthesia Transfer of Care Note  Patient: Javier Hall  Procedure(s) Performed: Procedure(s): TOTAL KNEE ARTHROPLASTY (Left)  Patient Location: PACU  Anesthesia Type:MAC and Spinal  Level of Consciousness: awake, alert  and oriented  Airway & Oxygen Therapy: Patient Spontanous Breathing and Patient connected to face mask oxygen  Post-op Assessment: Report given to RN and Post -op Vital signs reviewed and stable  Post vital signs: Reviewed and stable  Last Vitals:  Vitals:   11/19/15 1026  BP: (!) 173/71  Pulse: (!) 59  Resp: 20  Temp: 36.7 C    Last Pain:  Vitals:   11/19/15 1026  TempSrc: Oral         Complications: No apparent anesthesia complications

## 2015-11-19 NOTE — Anesthesia Procedure Notes (Signed)
Spinal  Patient location during procedure: OR Staffing Anesthesiologist: Montez Hageman Performed: anesthesiologist  Preanesthetic Checklist Completed: patient identified, site marked, surgical consent, pre-op evaluation, timeout performed, IV checked, risks and benefits discussed and monitors and equipment checked Spinal Block Patient position: sitting Prep: Betadine Patient monitoring: heart rate, continuous pulse ox and blood pressure Approach: midline Location: L4-5 Injection technique: single-shot Needle Needle type: Sprotte  Needle gauge: 24 G Needle length: 9 cm Additional Notes Expiration date of kit checked and confirmed. Patient tolerated procedure well, without complications.

## 2015-11-19 NOTE — Anesthesia Preprocedure Evaluation (Addendum)
Anesthesia Evaluation  Patient identified by MRN, date of birth, ID band Patient awake    Reviewed: Allergy & Precautions, NPO status , Patient's Chart, lab work & pertinent test results  History of Anesthesia Complications (+) PONV  Airway Mallampati: II  TM Distance: >3 FB Neck ROM: Full    Dental no notable dental hx.    Pulmonary neg pulmonary ROS,    Pulmonary exam normal breath sounds clear to auscultation       Cardiovascular hypertension, Pt. on medications Normal cardiovascular exam Rhythm:Regular Rate:Normal     Neuro/Psych negative neurological ROS  negative psych ROS   GI/Hepatic negative GI ROS, Neg liver ROS,   Endo/Other  negative endocrine ROS  Renal/GU negative Renal ROS  negative genitourinary   Musculoskeletal negative musculoskeletal ROS (+)   Abdominal   Peds negative pediatric ROS (+)  Hematology negative hematology ROS (+)   Anesthesia Other Findings   Reproductive/Obstetrics negative OB ROS                             Anesthesia Physical Anesthesia Plan  ASA: II  Anesthesia Plan: Spinal   Post-op Pain Management:    Induction: Intravenous  Airway Management Planned: Simple Face Mask  Additional Equipment:   Intra-op Plan:   Post-operative Plan: Extubation in OR  Informed Consent: I have reviewed the patients History and Physical, chart, labs and discussed the procedure including the risks, benefits and alternatives for the proposed anesthesia with the patient or authorized representative who has indicated his/her understanding and acceptance.   Dental advisory given  Plan Discussed with: CRNA  Anesthesia Plan Comments:        Anesthesia Quick Evaluation

## 2015-11-19 NOTE — Interval H&P Note (Signed)
History and Physical Interval Note:  11/19/2015 10:53 AM  Javier Hall  has presented today for surgery, with the diagnosis of djd left knee  The various methods of treatment have been discussed with the patient and family. After consideration of risks, benefits and other options for treatment, the patient has consented to  Procedure(s): TOTAL KNEE ARTHROPLASTY (Left) as a surgical intervention .  The patient's history has been reviewed, patient examined, no change in status, stable for surgery.  I have reviewed the patient's chart and labs.  Questions were answered to the patient's satisfaction.     Ninetta Lights

## 2015-11-19 NOTE — Discharge Instructions (Signed)

## 2015-11-20 ENCOUNTER — Encounter (HOSPITAL_COMMUNITY): Payer: Self-pay | Admitting: Orthopedic Surgery

## 2015-11-20 LAB — BASIC METABOLIC PANEL
ANION GAP: 5 (ref 5–15)
BUN: 15 mg/dL (ref 6–20)
CHLORIDE: 107 mmol/L (ref 101–111)
CO2: 24 mmol/L (ref 22–32)
CREATININE: 1 mg/dL (ref 0.61–1.24)
Calcium: 8.1 mg/dL — ABNORMAL LOW (ref 8.9–10.3)
GFR calc non Af Amer: >60 mL/min (ref >60)
Glucose, Bld: 159 mg/dL — ABNORMAL HIGH (ref 65–99)
Potassium: 4.2 mmol/L (ref 3.5–5.1)
Sodium: 136 mmol/L (ref 135–145)

## 2015-11-20 LAB — CBC
HEMATOCRIT: 36.6 % — AB (ref 39.0–52.0)
HEMOGLOBIN: 12.3 g/dL — AB (ref 13.0–17.0)
MCH: 29.5 pg (ref 26.0–34.0)
MCHC: 33.6 g/dL (ref 30.0–36.0)
MCV: 87.8 fL (ref 78.0–100.0)
Platelets: 194 10*3/uL (ref 150–400)
RBC: 4.17 MIL/uL — AB (ref 4.22–5.81)
RDW: 14 % (ref 11.5–15.5)
WBC: 12.8 10*3/uL — AB (ref 4.0–10.5)

## 2015-11-20 NOTE — Op Note (Signed)
Javier Hall, [place] NO.:  [id number]  MEDICAL RECORD NO.:  [id number]  LOCATION:  5N10C                        FACILITY:  [facility]  PHYSICIAN:  Ninetta Lights, M.D. DATE OF BIRTH:  08/23/1935  DATE OF PROCEDURE:  11/19/2015 DATE OF DISCHARGE:                              OPERATIVE REPORT   PREOPERATIVE DIAGNOSES:  Left knee end-stage arthritis, primary localized.  Varus alignment.  5-degree flexion contracture.  POSTOPERATIVE DIAGNOSES:  Left knee end-stage arthritis, primary localized.  Varus alignment. 5-degree flexion contracture.  PROCEDURE:  Left knee modified minimally invasive total knee replacement with Stryker triathlon prosthesis.  Cemented posterior stabilized #6 femoral component.  Cemented #6 tibial component, 9-mm PS insert. Cemented resurfacing 38-mm patellar component.  SURGEON:  Ninetta Lights, M.D.  ASSISTANT:  Elmyra Ricks, P.A., who was present throughout the entire case and necessary for timely completion of procedure.  ANESTHESIA:  Spinal.  BLOOD LOSS:  Minimal.  SPECIMENS:  None.  CULTURES:  None.  COMPLICATIONS:  None.  DRESSINGS:  Soft compressive knee immobilizer.  TOURNIQUET TIME:  50 minutes.  DESCRIPTION OF PROCEDURE:  The patient was brought to the operating room, and after adequate anesthesia had been obtained, tourniquet applied.  Prepped and draped in usual sterile fashion.  Exsanguinated with elevation of Esmarch.  Tourniquet inflated to 350 mmHg.  Straight incision above the patella down the tibial tubercle.  Medial arthrotomy, vastus splitting, preserving quad tendon.  Spurs removed.  Flexible intramedullary guide distal femur.  10-mm resection, 5 degrees of valgus.  Using epicondylar axis, the femur was sized, cut, and fitted for a pegged, posterior stabilized #6 component.  Proximal tibial resection below the defect medially.  Extramedullary guide.  Size #6 component, rotation set with trials.   Resurfacing patella with a 38-mm component after a 10-mm resection.  With the 6 component on the tibia, 9 mm insert.  Very pleased with balancing, stability, full motion, good patellar tracking.  Copious irrigation.  Cement prepared, placed on all components, firmly seated.  Polyethylene attached to tibia, knee reduced.  Patella held with a clamp.  Once the cement hardened, the knee was irrigated again.  Soft tissue was injected with Exparel.  Arthrotomy closed with #1 Vicryl.  Subcutaneous and subcuticular closure.  Margins were injected with Marcaine.  Sterile compressive dressing applied. Tourniquet deflated and removed.  Knee immobilizer applied.  Anesthesia reversed.  Brought to the recovery room.  Tolerated the surgery well. No complications.     Ninetta Lights, M.D.     DFM/MEDQ  D:  11/19/2015  T:  11/20/2015  Job:  [id number]

## 2015-11-20 NOTE — Evaluation (Signed)
Physical Therapy Evaluation Patient Details Name: Javier Hall MRN: [id number] DOB: May 01, 1935 Today's Date: 11/20/2015   History of Present Illness  Pt is a 80 y.o. male s/p L total knee replacement. Pt has a past medical history significant for hypertension, mild dementia, post-operative nausea and vomiting. Pt reports surgical history of previous arthroscopic L knee surgery.  Clinical Impression  Pt is POD #1 and moving well.  He lives with a wife who cannot physically help him, so he is planning to d/c to SNF for rehab before going home where he will need to be mod I or S at discharge.   PT to follow acutely for deficits listed below.     Follow Up Recommendations SNF;Other (comment) (wants [facility] )    Equipment Recommendations  Rolling walker with 5" wheels    Recommendations for Other Services   NA    Precautions / Restrictions Precautions Precautions: Knee Precaution Booklet Issued: No Required Braces or Orthoses: Knee Immobilizer - Left Knee Immobilizer - Left: On at all times Restrictions Weight Bearing Restrictions: Yes LLE Weight Bearing: Weight bearing as tolerated      Mobility  Bed Mobility Overal bed mobility: Needs Assistance Bed Mobility: Supine to Sit     Supine to sit: Min guard;HOB elevated (trapeze bar)     General bed mobility comments: No physical assist needed with HOB elevated and use of trapeze bar but needs min guard assist for safety.  Transfers Overall transfer level: Needs assistance Equipment used: Rolling walker (2 wheeled) Transfers: Sit to/from Stand Sit to Stand: Min assist         General transfer comment: Min assist to stabilize RW and trunk for balance.  Verbal cues for safe hand placement and to stand for a moment before he started walking to make sure he was not lightheaded.   Ambulation/Gait Ambulation/Gait assistance: Min guard Ambulation Distance (Feet): 65 Feet Assistive device: Rolling walker (2  wheeled) Gait Pattern/deviations: Step-to pattern;Antalgic Gait velocity: decreased Gait velocity interpretation: Below normal speed for age/gender General Gait Details: Verbal cues for correct LE sequencing, safe RW use.          Balance Overall balance assessment: Needs assistance Sitting-balance support: Feet supported;No upper extremity supported Sitting balance-[name]: Good     Standing balance support: Bilateral upper extremity supported Standing balance-[name]: Poor                               Pertinent Vitals/Pain Pain Assessment: 0-10 Pain Score: 3  Pain Location: left knee Pain Descriptors / Indicators: Aching;Burning Pain Intervention(s): Limited activity within patient's tolerance;Monitored during session;Repositioned    Home Living Family/patient expects to be discharged to:: Skilled nursing facility Living Arrangements: Spouse/significant other Available Help at Discharge: Family Type of Home: House Home Access: Ramped entrance     Home Layout: One level Home Equipment: Bedside commode Additional Comments: Wife with B shoulder problems and unable to provide assist. Pt has RW, cane, and toilet riser at home.     Prior Function Level of Independence: Independent with assistive device(s)         Comments: Used cane intermittently for longer distances     Hand Dominance   Dominant Hand: Right    Extremity/Trunk Assessment   Upper Extremity Assessment: Defer to OT evaluation           Lower Extremity Assessment: LLE deficits/detail   LLE Deficits / Details: normal post op pain  and weakness, ankle at least 4/5, knee 2+/5, hip flexion 3-/5  Cervical / Trunk Assessment: Normal  Communication   Communication: No difficulties  Cognition Arousal/Alertness: Awake/alert Behavior During Therapy: WFL for tasks assessed/performed Overall Cognitive Status: Within Functional Limits for tasks assessed                          Exercises Total Joint Exercises Ankle Circles/Pumps: AROM;Both;10 reps Quad Sets: AROM;Left;10 reps Towel Squeeze: AROM;Both;10 reps Heel Slides: AAROM;Left;10 reps Goniometric ROM: 8-80   Assessment/Plan    PT Assessment Patient needs continued PT services  PT Problem List Decreased strength;Decreased range of motion;Decreased activity tolerance;Decreased balance;Decreased mobility;Decreased knowledge of use of DME;Decreased knowledge of precautions;Pain          PT Treatment Interventions DME instruction;Gait training;Stair training;Functional mobility training;Therapeutic activities;Therapeutic exercise;Balance training;Patient/family education;Manual techniques;Modalities    PT Goals (Current goals can be found in the Care Plan section)  Acute Rehab PT Goals Patient Stated Goal: go to SNF for more rehab PT Goal Formulation: With patient Time For Goal Achievement: 11/27/15 Potential to Achieve Goals: Good    Frequency 7X/week           End of Session Equipment Utilized During Treatment: Gait belt;Left knee immobilizer Activity Tolerance: Patient limited by pain Patient left: in bed;with call bell/phone within reach;with family/visitor present           Time: 1112-1140 PT Time Calculation (min) (ACUTE ONLY): 28 min   Charges:   PT Evaluation $PT Eval Moderate Complexity: 1 Procedure PT Treatments $Gait Training: 8-22 mins        Kaymarie Wynn B. Kyasia Steuck, PT, DPT [phone]   11/20/2015, 3:06 PM

## 2015-11-20 NOTE — Progress Notes (Signed)
Orthopedic Tech Progress Note Patient Details:  Javier Hall 12-29-1935 [id number]  Patient ID: Javier Hall, male   DOB: [birth year], 80 y.o.   MRN: [id number] Pt refused CPM at this time.  [name] 11/20/2015, 5:49 AM

## 2015-11-20 NOTE — Progress Notes (Addendum)
Subjective: 1 Day Post-Op Procedure(s) (LRB): TOTAL KNEE ARTHROPLASTY (Left) Patient reports pain as mild.  No nausea/vomiting, lightheadedness/dizziness, chest pain/sob.    Objective: Vital signs in last 24 hours: Temp:  [97.4 F (36.3 C)-98 F (36.7 C)] 97.9 F (36.6 C) (11/09 0457) Pulse Rate:  [59-79] 69 (11/09 0457) Resp:  [11-21] 16 (11/09 0457) BP: (138-173)/(57-75) 138/70 (11/09 0457) SpO2:  [93 %-100 %] 97 % (11/09 0457) Weight:  [83.5 kg (184 lb)] 83.5 kg (184 lb) (11/08 1026)  Intake/Output from previous day: 11/08 0701 - 11/09 0700 In: 2476.7 [I.V.:2476.7] Out: 495 [Urine:475; Blood:20] Intake/Output this shift: No intake/output data recorded.   Recent Labs  11/20/15 0540  HGB 12.3*    Recent Labs  11/20/15 0540  WBC 12.8*  RBC 4.17*  HCT 36.6*  PLT 194    Recent Labs  11/20/15 0540  NA 136  K 4.2  CL 107  CO2 24  BUN 15  CREATININE 1.00  GLUCOSE 159*  CALCIUM 8.1*   No results for input(s): LABPT, INR in the last 72 hours.  Neurologically intact Neurovascular intact Sensation intact distally Intact pulses distally Dorsiflexion/Plantar flexion intact Incision: dressing C/D/I No cellulitis present Compartment soft  No calf ttp bilaterally  Assessment/Plan: 1 Day Post-Op Procedure(s) (LRB): TOTAL KNEE ARTHROPLASTY (Left) Advance diet Up with therapy D/C IV fluids Discharge to SNF once approved by insurance WBAT LLE Dry dressing change prn  Fannie Knee 11/20/2015, 7:19 AM

## 2015-11-20 NOTE — NC FL2 (Signed)
[place] LEVEL OF CARE SCREENING TOOL     IDENTIFICATION  Patient Name: Javier Hall Birthdate: 07-02-1935 Sex: male Admission Date (Current Location): 11/19/2015  [facility] and [place] Number:  Herbalist and Address:  [facility] [place], [place] [address], [place], [place] [address]      Provider Number: [id number]  Attending Physician Name and Address:  Ninetta Lights, MD  Relative Name and Phone Number:       Current Level of Care: Hospital Recommended Level of Care: [place] Prior Approval Number:    Date Approved/Denied: 11/20/15 PASRR Number:  ([id number])  Discharge Plan: SNF    Current Diagnoses: Patient Active Problem List   Diagnosis Date Noted  . S/P total knee replacement 11/19/2015    Orientation RESPIRATION BLADDER Height & Weight     Self, Time, Place, Situation  Normal   Weight: 184 lb (83.5 kg) Height:  5\' 11"  (180.3 cm)  BEHAVIORAL SYMPTOMS/MOOD NEUROLOGICAL BOWEL NUTRITION STATUS      Continent  (Normal)  AMBULATORY STATUS COMMUNICATION OF NEEDS Skin   Limited Assist Verbally Normal                       Personal Care Assistance Level of Assistance  Bathing, Dressing Bathing Assistance: Limited assistance   Dressing Assistance: Limited assistance     Functional Limitations Info             SPECIAL CARE FACTORS FREQUENCY                       Contractures      Additional Factors Info   (Full)               Current Medications (11/20/2015):  This is the current hospital active medication list Current Facility-Administered Medications  Medication Dose Route Frequency Provider Last Rate Last Dose  . acetaminophen (TYLENOL) tablet 650 mg  650 mg Oral Q6H PRN Aundra Dubin, PA-C       Or  . acetaminophen (TYLENOL) suppository 650 mg  650 mg Rectal Q6H PRN Aundra Dubin, PA-C      . alum & mag hydroxide-simeth (MAALOX/MYLANTA) 200-200-20 MG/5ML  suspension 30 mL  30 mL Oral Q4H PRN Aundra Dubin, PA-C      . aspirin EC tablet 325 mg  325 mg Oral Q breakfast Aundra Dubin, PA-C   325 mg at 11/20/15 0752  . bisacodyl (DULCOLAX) EC tablet 5 mg  5 mg Oral Daily PRN Aundra Dubin, PA-C      . celecoxib (CELEBREX) capsule 200 mg  200 mg Oral Q12H Aundra Dubin, PA-C   200 mg at 11/20/15 0954  . diphenhydrAMINE (BENADRYL) 12.5 MG/5ML elixir 12.5-25 mg  12.5-25 mg Oral Q4H PRN Aundra Dubin, PA-C      . docusate sodium (COLACE) capsule 100 mg  100 mg Oral BID Aundra Dubin, PA-C   100 mg at 11/20/15 0954  . donepezil (ARICEPT) tablet 10 mg  10 mg Oral Daily Aundra Dubin, PA-C   10 mg at 11/20/15 0954  . HYDROmorphone (DILAUDID) injection 0.5-1 mg  0.5-1 mg Intravenous Q2H PRN Ninetta Lights, MD      . lisinopril (PRINIVIL,ZESTRIL) tablet 10 mg  10 mg Oral Daily Aundra Dubin, PA-C   10 mg at 11/20/15 0954  . magnesium citrate solution 1 Bottle  1 Bottle Oral Once  PRN Aundra Dubin, PA-C      . memantine (NAMENDA XR) 24 hr capsule 28 mg  28 mg Oral Daily Aundra Dubin, PA-C   28 mg at 11/20/15 0954  . menthol-cetylpyridinium (CEPACOL) lozenge 3 mg  1 lozenge Oral PRN Aundra Dubin, PA-C       Or  . phenol (CHLORASEPTIC) mouth spray 1 spray  1 spray Mouth/Throat PRN Aundra Dubin, PA-C      . metoCLOPramide (REGLAN) tablet 5-10 mg  5-10 mg Oral Q8H PRN Aundra Dubin, PA-C       Or  . metoCLOPramide (REGLAN) injection 5-10 mg  5-10 mg Intravenous Q8H PRN Aundra Dubin, PA-C      . ondansetron [facility]) tablet 4 mg  4 mg Oral Q6H PRN Aundra Dubin, PA-C   4 mg at 11/20/15 [id number]   Or  . ondansetron (ZOFRAN) injection 4 mg  4 mg Intravenous Q6H PRN Aundra Dubin, PA-C      . oxyCODONE (Oxy IR/ROXICODONE) immediate release tablet 5-10 mg  5-10 mg Oral Q3H PRN Aundra Dubin, PA-C   10 mg at 11/20/15 1122  . polyethylene glycol (MIRALAX / GLYCOLAX) packet 17 g  17 g Oral Daily PRN Aundra Dubin, PA-C      . zolpidem  (AMBIEN) tablet 5 mg  5 mg Oral QHS PRN Aundra Dubin, PA-C         Discharge Medications: Please see discharge summary for a list of discharge medications.  Relevant Imaging Results:  Relevant Lab Results:   Additional Information  (SS: [id number])  Bernita Buffy

## 2015-11-20 NOTE — Evaluation (Signed)
Occupational Therapy Evaluation Patient Details Name: Javier Hall MRN: [id number] DOB: [birth year] Today's Date: 11/20/2015    History of Present Illness Pt is a 80 y.o. male s/p L total knee replacement. Pt has a past medical history significant for hypertension, mild dementia, post-operative nausea and vomiting. Pt reports surgical history of previous arthroscopic L knee surgery.   Clinical Impression   PTA, pt was independent with ADL and was using a cane for long distance community mobility. Pt currently requires min assist for LB ADL and functional mobility. Pt lives with wife who has a history of shoulder problems and is not able to provide much physical assistance at home. Pt plans to D/C to SNF for continued rehabilitation prior to returning home. Recommend SNF placement for short term rehabilitation in order to improve independence with ADL. Pt would benefit from continued OT services while admitted to improve independence with ADL and functional mobility. OT will continue to follow acutely.    Follow Up Recommendations  SNF    Equipment Recommendations  Other (comment) (TBD at next venue)       Precautions / Restrictions Precautions Precautions: Knee Precaution Booklet Issued: No Required Braces or Orthoses: Knee Immobilizer - Left Knee Immobilizer - Left: On at all times Restrictions Weight Bearing Restrictions: Yes LLE Weight Bearing: Weight bearing as tolerated      Mobility Bed Mobility Overal bed mobility: Needs Assistance Bed Mobility: Supine to Sit     Supine to sit: Min guard;HOB elevated (trapeze bar)     General bed mobility comments: No physical assist needed with HOB elevated and use of trapeze bar but needs min guard assist for safety.  Transfers Overall transfer level: Needs assistance Equipment used: Rolling walker (2 wheeled) Transfers: Sit to/from Stand Sit to Stand: Min assist         General transfer comment: Min assist to boost into  standing. Pt required VC's for hand placement and safety.    Balance Overall balance assessment: Needs assistance Sitting-balance support: Single extremity supported;Feet supported Sitting balance-[name]: Fair     Standing balance support: Bilateral upper extremity supported;During functional activity Standing balance-[name]: Poor Standing balance comment: Reliant on RW for support                            ADL Overall ADL's : Needs assistance/impaired     Grooming: Set up;Sitting   Upper Body Bathing: Set up;Sitting   Lower Body Bathing: Minimal assistance;Sit to/from stand   Upper Body Dressing : Set up;Sitting   Lower Body Dressing: Minimal assistance;Sit to/from stand   Toilet Transfer: Minimal assistance;Ambulation;BSC;RW   Toileting- Clothing Manipulation and Hygiene: Minimal assistance;Sit to/from stand       Functional mobility during ADLs: Minimal assistance;Rolling walker General ADL Comments: Pt and daughter educated concerning no pillow under knee, safe ADL post-operatively, dressing techniques, toilet transfer, and use of ice for pain management.     Vision Vision Assessment?: No apparent visual deficits          Pertinent Vitals/Pain Pain Assessment: 0-10 Pain Score: 3  Pain Location: L knee Pain Descriptors / Indicators: Aching;Operative site guarding Pain Intervention(s): Limited activity within patient's tolerance;Monitored during session;Repositioned;Ice applied     Hand Dominance Right   Extremity/Trunk Assessment Upper Extremity Assessment Upper Extremity Assessment: Overall WFL for tasks assessed   Lower Extremity Assessment Lower Extremity Assessment: LLE deficits/detail LLE Deficits / Details: Decreased strength and ROM as expected post-operatively.  Communication Communication Communication: No difficulties   Cognition Arousal/Alertness: Awake/alert Behavior During Therapy: WFL for tasks  assessed/performed Overall Cognitive Status: Within Functional Limits for tasks assessed                                Home Living Family/patient expects to be discharged to:: Skilled nursing facility Living Arrangements: Spouse/significant other                               Additional Comments: Wife with B shoulder problems and unable to provide assist. Pt has RW, cane, and toilet riser at home.       Prior Functioning/Environment Level of Independence: Independent with assistive device(s)        Comments: Used cane intermittently for longer distances        OT Problem List: Decreased strength;Decreased range of motion;Decreased activity tolerance;Impaired balance (sitting and/or standing);Decreased safety awareness;Decreased knowledge of use of DME or AE;Decreased knowledge of precautions;Pain   OT Treatment/Interventions: Self-care/ADL training;Therapeutic exercise;Therapeutic activities;Patient/family education;Balance training    OT Goals(Current goals can be found in the care plan section) Acute Rehab OT Goals Patient Stated Goal: go to SNF for more rehab OT Goal Formulation: With patient/family Time For Goal Achievement: 12/04/15 Potential to Achieve Goals: Good ADL Goals Pt Will Perform Lower Body Bathing: with modified independence;sit to/from stand Pt Will Perform Lower Body Dressing: with modified independence;sit to/from stand Pt Will Transfer to Toilet: with modified independence;ambulating;regular height toilet Pt Will Perform Toileting - Clothing Manipulation and hygiene: with modified independence;sit to/from stand Pt Will Perform Tub/Shower Transfer: with min guard assist;ambulating;3 in 1;rolling walker  OT Frequency: Min 1X/week    End of Session Equipment Utilized During Treatment: Gait belt;Rolling walker CPM Left Knee CPM Left Knee: Off Additional Comments: Pt Refused CPM at this time.  Activity Tolerance: Patient tolerated  treatment well Patient left: in chair;with call bell/phone within reach;with family/visitor present   Time: [id number] OT Time Calculation (min): 34 min Charges:  OT General Charges $OT Visit: 1 Procedure OT Evaluation $OT Eval Moderate Complexity: 1 Procedure OT Treatments $Self Care/Home Management : 8-22 mins  Norman Herrlich, OTR/L [phone] 11/20/2015, 9:35 AM

## 2015-11-20 NOTE — Progress Notes (Signed)
PT Cancellation Note  Patient Details Name: Javier Hall MRN: [id number] DOB: [birth year]   Cancelled Treatment:    Reason Eval/Treat Not Completed: Medical issues which prohibited therapy. Pt is nauseated.   Scheryl Marten PT, DPT  [phone]  11/20/2015, 4:04 PM

## 2015-11-20 NOTE — Clinical Social Work Note (Signed)
Clinical Social Work Assessment  Patient Details  Name: Javier Hall MRN: [id number] Date of Birth: 1935-06-01  Date of referral:  11/20/15               Reason for consult:  Facility Placement                Permission sought to share information with:   (Facilities) Permission granted to share information::   (Facilities)  Name::        Agency::     Relationship::     Contact Information:     Housing/Transportation Living arrangements for the past 2 months:  Single Family Home (Home with wife ) Source of Information:  Patient Patient Interpreter Needed:  None Criminal Activity/Legal Involvement Pertinent to Current Situation/Hospitalization:  No - Comment as needed Significant Relationships:  Spouse Lives with:  Spouse Do you feel safe going back to the place where you live?   (Patient is interested in facilty ) Need for family participation in patient care:  Yes (Comment) (Patient states that his wife is his primary support)  Care giving concerns:  Patient needs assistance with completing ADLs.    Social Worker assessment / plan:  SW met with patient at bedside. Patient was alert and oriented. Patient states that he is interested in SNF. Patient is agreeable to be referred to facilities. However, he states that he has been accepted by [facility] . SW informed him that she has not heard from [facility] staff stating that he has been pre accepted but she will refer him out to that facilitiy and others in order to obtain placement.  Patient is agreeable.   Employment status:  Retired Forensic scientist:   Doctor, general practice) PT Recommendations:  [place] / Referral to community resources:   (SNF)  Patient/Family's Response to care:  Appropriate.  Patient/Family's Understanding of and Emotional Response to Diagnosis, Current Treatment, and Prognosis:  No questions.   Emotional Assessment Appearance:  Appears stated  age Attitude/Demeanor/Rapport:   (Appropriate) Affect (typically observed):  Accepting Orientation:  Oriented to Self, Oriented to Place, Oriented to  Time, Oriented to Situation Alcohol / Substance use:  Not Applicable Psych involvement (Current and /or in the community):  No (Comment)  Discharge Needs  Concerns to be addressed:  No discharge needs identified Readmission within the last 30 days:  No Current discharge risk:  None Barriers to Discharge:  No Barriers Identified   Tilda Burrow R 11/20/2015, 3:15 PM

## 2015-11-20 NOTE — Progress Notes (Signed)
Orthopedic Tech Progress Note Patient Details:  Javier Hall September 10, 1935 [id number]  CPM Left Knee CPM Left Knee: On Left Knee Flexion (Degrees): 90 Left Knee Extension (Degrees): 0 Additional Comments: Pt Refused CPM at this time.   [place] Pink 11/20/2015, 3:54 PM

## 2015-11-21 LAB — CBC
HCT: 36.8 % — ABNORMAL LOW (ref 39.0–52.0)
Hemoglobin: 12.7 g/dL — ABNORMAL LOW (ref 13.0–17.0)
MCH: 30 pg (ref 26.0–34.0)
MCHC: 34.5 g/dL (ref 30.0–36.0)
MCV: 87 fL (ref 78.0–100.0)
PLATELETS: 215 10*3/uL (ref 150–400)
RBC: 4.23 MIL/uL (ref 4.22–5.81)
RDW: 13.9 % (ref 11.5–15.5)
WBC: 23.7 10*3/uL — AB (ref 4.0–10.5)

## 2015-11-21 LAB — BASIC METABOLIC PANEL
Anion gap: 7 (ref 5–15)
BUN: 14 mg/dL (ref 6–20)
CO2: 24 mmol/L (ref 22–32)
Calcium: 8.8 mg/dL — ABNORMAL LOW (ref 8.9–10.3)
Chloride: 105 mmol/L (ref 101–111)
Creatinine, Ser: 1.08 mg/dL (ref 0.61–1.24)
GFR calc Af Amer: >60 mL/min (ref >60)
Glucose, Bld: 152 mg/dL — ABNORMAL HIGH (ref 65–99)
POTASSIUM: 4.6 mmol/L (ref 3.5–5.1)
SODIUM: 136 mmol/L (ref 135–145)

## 2015-11-21 NOTE — Progress Notes (Signed)
Physical Therapy Treatment Patient Details Name: Javier Hall MRN: [id number] DOB: 02/16/1935 Today's Date: 11/21/2015    History of Present Illness Pt is a 80 y.o. male s/p L total knee replacement. Pt has a past medical history significant for hypertension, mild dementia, post-operative nausea and vomiting. Pt reports surgical history of previous arthroscopic L knee surgery.    PT Comments    Pt is nauseated, sweating during mobility.  VSS (see vitals flow sheet) when checked during session.  Pt continues to be appropriate for SNF placement as his wife has physical and health issues and will be unable to provide assistance at discharge.  He continues to be a high fall risk and would benefit from SNF level rehab.    Follow Up Recommendations  SNF     Equipment Recommendations  Rolling walker with 5" wheels    Recommendations for Other Services   NA     Precautions / Restrictions Precautions Precautions: Knee Precaution Booklet Issued: Yes (comment) Precaution Comments: knee exercise handout given yesterday Required Braces or Orthoses: Knee Immobilizer - Left Knee Immobilizer - Left: On at all times Restrictions Weight Bearing Restrictions: Yes LLE Weight Bearing: Weight bearing as tolerated    Mobility  Bed Mobility Overal bed mobility: Needs Assistance Bed Mobility: Supine to Sit     Supine to sit: Supervision;HOB elevated     General bed mobility comments: supervision for safety.  Verbal cues for safe technique.  HOB elevated and pt using bed rail to pull up  Transfers Overall transfer level: Needs assistance Equipment used: Rolling walker (2 wheeled) Transfers: Sit to/from Stand Sit to Stand: Min guard         General transfer comment: Min guard assist for safety, balance, stabilize RW as despite verbal cues, pt stands up with hands on RW and stands quickly (a tad impulsive)  Ambulation/Gait Ambulation/Gait assistance: Min assist Ambulation Distance  (Feet): 65 Feet Assistive device: Rolling walker (2 wheeled) Gait Pattern/deviations: Step-through pattern;Antalgic Gait velocity: decreased Gait velocity interpretation: Below normal speed for age/gender General Gait Details: Pt feels nauseated while walking today, started to get sweaty and needed to sit down.  After sitting for ~5 mins he was able to continue and walk as far as he did yesterday, however, he was disappointed that he could not do more.  He has continuous hiccups today.         Balance Overall balance assessment: Needs assistance Sitting-balance support: No upper extremity supported;Feet supported Sitting balance-[name]: Good     Standing balance support: Bilateral upper extremity supported Standing balance-[name]: Poor                      Cognition Arousal/Alertness: Awake/alert Behavior During Therapy: WFL for tasks assessed/performed Overall Cognitive Status: Within Functional Limits for tasks assessed                      Exercises Total Joint Exercises Ankle Circles/Pumps: AROM;Both;10 reps Quad Sets: AROM;Left;10 reps Towel Squeeze: AROM;Both;10 reps Heel Slides: AAROM;Left;10 reps        Pertinent Vitals/Pain Pain Assessment: 0-10 Pain Score: 3  Pain Location: left knee Pain Descriptors / Indicators: Aching;Burning;Grimacing;Guarding Pain Intervention(s): Monitored during session;Limited activity within patient's tolerance;Repositioned           PT Goals (current goals can now be found in the care plan section) Acute Rehab PT Goals Patient Stated Goal: go to SNF for more rehab Progress towards PT goals: Not progressing  toward goals - comment (still nauseated)    Frequency    7X/week      PT Plan Current plan remains appropriate       End of Session Equipment Utilized During Treatment: Gait belt;Left knee immobilizer Activity Tolerance: Treatment limited secondary to medical complications (Comment)  (limited by nausea, sweating. ) Patient left: in chair;with call bell/phone within reach;with family/visitor present     Time: [id number] PT Time Calculation (min) (ACUTE ONLY): 25 min  Charges:  $Gait Training: 8-22 mins $Therapeutic Exercise: 8-22 mins                     Cantrell Martus B. Jimmy Stipes, PT, DPT [phone]   11/21/2015, 12:23 PM

## 2015-11-21 NOTE — Progress Notes (Signed)
Physical Therapy Treatment Patient Details Name: Javier Hall MRN: [id number] DOB: 10-19-1935 Today's Date: 11/21/2015    History of Present Illness Pt is a 80 y.o. male s/p L total knee replacement. Pt has a past medical history significant for hypertension, mild dementia, post-operative nausea and vomiting. Pt reports surgical history of previous arthroscopic L knee surgery.    PT Comments    Pt continues to have c/o nausea and fatigue since surgery and this is limiting his progress with strength and endurance. Pt will not be able to afford SNF placement at this time and is expected to DC home with [facility] services. Pt is slightly impulsive and have safety issues which will need to be resolved once home.    Follow Up Recommendations  Home health PT     Equipment Recommendations  Rolling walker with 5" wheels    Recommendations for Other Services       Precautions / Restrictions Precautions Precautions: Knee Precaution Booklet Issued: Yes (comment) Precaution Comments: knee exercise handout given yesterday Required Braces or Orthoses: Knee Immobilizer - Left Knee Immobilizer - Left: On at all times Restrictions Weight Bearing Restrictions: Yes LLE Weight Bearing: Weight bearing as tolerated    Mobility  Bed Mobility Overal bed mobility: Needs Assistance Bed Mobility: Supine to Sit     Supine to sit: Supervision;HOB elevated     General bed mobility comments: supervision for safety.  Verbal cues for safe technique.  HOB elevated and pt using bed rail to pull up  Transfers Overall transfer level: Needs assistance Equipment used: Rolling walker (2 wheeled) Transfers: Sit to/from Stand Sit to Stand: Min guard         General transfer comment: Min guard assist for safety, balance, stabilize RW as despite verbal cues, pt stands up with hands on RW and stands quickly (a tad impulsive)  Ambulation/Gait Ambulation/Gait assistance: Min assist Ambulation Distance  (Feet): 65 Feet Assistive device: Rolling walker (2 wheeled) Gait Pattern/deviations: Step-through pattern Gait velocity: decreased Gait velocity interpretation: Below normal speed for age/gender General Gait Details: Pt had to turn around and return to room. Ambulated to bathroom and pt tried to get up withot assistasnce and had a lob but was able to self recover. Continues to feel nauseated   Stairs            Wheelchair Mobility    Modified Rankin (Stroke Patients Only)       Balance Overall balance assessment: Needs assistance Sitting-balance support: No upper extremity supported Sitting balance-[name]: Good     Standing balance support: Bilateral upper extremity supported Standing balance-[name]: Poor                      Cognition Arousal/Alertness: Awake/alert Behavior During Therapy: WFL for tasks assessed/performed Overall Cognitive Status: Within Functional Limits for tasks assessed                      Exercises Total Joint Exercises Ankle Circles/Pumps: AROM;Both;10 reps Quad Sets: AROM;Left;10 reps Towel Squeeze: AROM;Both;10 reps Heel Slides: AAROM;Left;10 reps Goniometric ROM: 2-90    General Comments        Pertinent Vitals/Pain Pain Assessment: 0-10 Pain Score: 5  Pain Location: left knee Pain Descriptors / Indicators: Aching;Burning;Sore Pain Intervention(s): Monitored during session;Repositioned;Premedicated before session    Home Living                      Prior Function  PT Goals (current goals can now be found in the care plan section) Acute Rehab PT Goals Patient Stated Goal: go home and get stronger Progress towards PT goals: Progressing toward goals    Frequency    7X/week      PT Plan Current plan remains appropriate    Co-evaluation             End of Session Equipment Utilized During Treatment: Gait belt;Left knee immobilizer Activity Tolerance: Patient  tolerated treatment well;Patient limited by fatigue Patient left: in bed;with call bell/phone within reach;with family/visitor present     Time: 1410-1431 PT Time Calculation (min) (ACUTE ONLY): 21 min  Charges:  $Gait Training: 8-22 mins $Therapeutic Exercise: 8-22 mins                    G Codes:      Scheryl Marten PT, DPT  [phone]   11/21/2015, 2:37 PM

## 2015-11-21 NOTE — Care Management Note (Signed)
Case Management Note  Patient Details  Name: EMETT ORFIELD MRN: [id number] Date of Birth: 1935/04/26  Subjective/Objective:   Pt lives with wife, son is also supportive.  Pt and family have decided on discharge to home with home health PT vs SNF for rehab.  Pt already has rolling walker, CPM machine will be delivered to home. Provided list of home health agencies and referral called to [facility] per choice.                            Expected Discharge Plan:  [place]  Discharge planning Services  CM Consult  Post Acute Care Choice:  Home Health Choice offered to:   Patient  HH Arranged:  PT [place]:  [facility] (now [facility])  Status of Service:  Completed, signed off  Girard Cooter, [place] 11/21/2015, 3:00 PM

## 2015-11-21 NOTE — Progress Notes (Signed)
Orthopedic Tech Progress Note Patient Details:  Javier Hall 1935/04/09 [id number]  Patient ID: Javier Hall, male   DOB: 07/20/1935, 80 y.o.   MRN: [id number] Pt refused cpm. Wants to wait till after he gets a bath. Will call.  Karolee Stamps 11/21/2015, 6:09 AM

## 2015-11-21 NOTE — Progress Notes (Signed)
SW spoke with son and pt at bedside. SW informed both that the facility that they initially wanted is not in their network. Both expressed understanding but were disappointed.   SW informed that at this time the only facility offering them a bed that will accept them today is [facility]. However, due to insurance they will have to cover 40%. At this time, pt and son are debating SNF vs home. SW will continue to follow up.  Tilda Burrow, MSW

## 2015-11-21 NOTE — Progress Notes (Signed)
Patient discharged to home, discharge instructions given, patient stated he understood

## 2015-11-21 NOTE — Progress Notes (Signed)
Subjective: 2 Days Post-Op Procedure(s) (LRB): TOTAL KNEE ARTHROPLASTY (Left) Patient reports pain as mild.  Some nausea/lightheadedness when standing.  Objective: Vital signs in last 24 hours: Temp:  [97.6 F (36.4 C)-98.1 F (36.7 C)] 97.6 F (36.4 C) (11/10 0516) Pulse Rate:  [56-93] 56 (11/10 1139) Resp:  [18] 18 (11/10 0516) BP: (153-157)/(61-70) 156/70 (11/10 1139) SpO2:  [90 %-96 %] 90 % (11/10 1139)  Intake/Output from previous day: 11/09 0701 - 11/10 0700 In: 240 [P.O.:240] Out: 1725 [Urine:1725] Intake/Output this shift: Total I/O In: -  Out: 200 [Urine:200]   Recent Labs  11/20/15 0540 11/21/15 0217  HGB 12.3* 12.7*    Recent Labs  11/20/15 0540 11/21/15 0217  WBC 12.8* 23.7*  RBC 4.17* 4.23  HCT 36.6* 36.8*  PLT 194 215    Recent Labs  11/20/15 0540 11/21/15 0217  NA 136 136  K 4.2 4.6  CL 107 105  CO2 24 24  BUN 15 14  CREATININE 1.00 1.08  GLUCOSE 159* 152*  CALCIUM 8.1* 8.8*   No results for input(s): LABPT, INR in the last 72 hours.  Neurologically intact Neurovascular intact Sensation intact distally Intact pulses distally Dorsiflexion/Plantar flexion intact Incision: dressing C/D/I No cellulitis present Compartment soft  Assessment/Plan: 2 Days Post-Op Procedure(s) (LRB): TOTAL KNEE ARTHROPLASTY (Left) Advance diet Up with therapy Discharge home with home health likely after second PT  Session as long as no severe nausea/vomiting. WBAT LLE ABLA-mild and stable Dry dressing change prn  Fannie Knee 11/21/2015, 12:44 PM

## 2016-01-13 DIAGNOSIS — M6281 Muscle weakness (generalized): Secondary | ICD-10-CM | POA: Diagnosis not present

## 2016-01-13 DIAGNOSIS — M25462 Effusion, left knee: Secondary | ICD-10-CM | POA: Diagnosis not present

## 2016-01-13 DIAGNOSIS — R2689 Other abnormalities of gait and mobility: Secondary | ICD-10-CM | POA: Diagnosis not present

## 2016-01-13 DIAGNOSIS — M25662 Stiffness of left knee, not elsewhere classified: Secondary | ICD-10-CM | POA: Diagnosis not present

## 2016-01-13 DIAGNOSIS — R2681 Unsteadiness on feet: Secondary | ICD-10-CM | POA: Diagnosis not present

## 2016-01-13 DIAGNOSIS — M25562 Pain in left knee: Secondary | ICD-10-CM | POA: Diagnosis not present

## 2016-01-13 DIAGNOSIS — Z96652 Presence of left artificial knee joint: Secondary | ICD-10-CM | POA: Diagnosis not present

## 2016-02-09 DIAGNOSIS — N401 Enlarged prostate with lower urinary tract symptoms: Secondary | ICD-10-CM | POA: Diagnosis not present

## 2016-02-09 DIAGNOSIS — Z125 Encounter for screening for malignant neoplasm of prostate: Secondary | ICD-10-CM | POA: Diagnosis not present

## 2016-04-06 DIAGNOSIS — M1712 Unilateral primary osteoarthritis, left knee: Secondary | ICD-10-CM | POA: Diagnosis not present

## 2016-04-28 DIAGNOSIS — I10 Essential (primary) hypertension: Secondary | ICD-10-CM | POA: Diagnosis not present

## 2016-04-28 DIAGNOSIS — G309 Alzheimer's disease, unspecified: Secondary | ICD-10-CM | POA: Diagnosis not present

## 2016-04-28 DIAGNOSIS — Z9181 History of falling: Secondary | ICD-10-CM | POA: Diagnosis not present

## 2016-04-28 DIAGNOSIS — N400 Enlarged prostate without lower urinary tract symptoms: Secondary | ICD-10-CM | POA: Diagnosis not present

## 2016-04-28 DIAGNOSIS — Z1389 Encounter for screening for other disorder: Secondary | ICD-10-CM | POA: Diagnosis not present

## 2016-04-28 DIAGNOSIS — E785 Hyperlipidemia, unspecified: Secondary | ICD-10-CM | POA: Diagnosis not present

## 2016-04-28 DIAGNOSIS — Z6826 Body mass index (BMI) 26.0-26.9, adult: Secondary | ICD-10-CM | POA: Diagnosis not present

## 2016-04-28 DIAGNOSIS — Z79899 Other long term (current) drug therapy: Secondary | ICD-10-CM | POA: Diagnosis not present

## 2016-08-09 DIAGNOSIS — N401 Enlarged prostate with lower urinary tract symptoms: Secondary | ICD-10-CM | POA: Diagnosis not present

## 2016-08-09 DIAGNOSIS — R351 Nocturia: Secondary | ICD-10-CM | POA: Diagnosis not present

## 2016-08-09 DIAGNOSIS — N3281 Overactive bladder: Secondary | ICD-10-CM | POA: Diagnosis not present

## 2016-08-30 DIAGNOSIS — R42 Dizziness and giddiness: Secondary | ICD-10-CM | POA: Diagnosis not present

## 2016-08-30 DIAGNOSIS — N400 Enlarged prostate without lower urinary tract symptoms: Secondary | ICD-10-CM | POA: Diagnosis not present

## 2016-08-30 DIAGNOSIS — G309 Alzheimer's disease, unspecified: Secondary | ICD-10-CM | POA: Diagnosis not present

## 2016-08-30 DIAGNOSIS — Z79899 Other long term (current) drug therapy: Secondary | ICD-10-CM | POA: Diagnosis not present

## 2016-08-30 DIAGNOSIS — E785 Hyperlipidemia, unspecified: Secondary | ICD-10-CM | POA: Diagnosis not present

## 2016-08-30 DIAGNOSIS — Z6826 Body mass index (BMI) 26.0-26.9, adult: Secondary | ICD-10-CM | POA: Diagnosis not present

## 2016-08-30 DIAGNOSIS — I10 Essential (primary) hypertension: Secondary | ICD-10-CM | POA: Diagnosis not present

## 2016-12-13 DIAGNOSIS — H9319 Tinnitus, unspecified ear: Secondary | ICD-10-CM | POA: Diagnosis not present

## 2016-12-13 DIAGNOSIS — H9190 Unspecified hearing loss, unspecified ear: Secondary | ICD-10-CM | POA: Diagnosis not present

## 2016-12-13 DIAGNOSIS — H8110 Benign paroxysmal vertigo, unspecified ear: Secondary | ICD-10-CM | POA: Diagnosis not present

## 2016-12-13 DIAGNOSIS — R42 Dizziness and giddiness: Secondary | ICD-10-CM | POA: Diagnosis not present

## 2016-12-13 DIAGNOSIS — H6122 Impacted cerumen, left ear: Secondary | ICD-10-CM | POA: Diagnosis not present

## 2016-12-13 DIAGNOSIS — J342 Deviated nasal septum: Secondary | ICD-10-CM | POA: Diagnosis not present

## 2016-12-27 DIAGNOSIS — H903 Sensorineural hearing loss, bilateral: Secondary | ICD-10-CM | POA: Diagnosis not present

## 2016-12-27 DIAGNOSIS — J342 Deviated nasal septum: Secondary | ICD-10-CM | POA: Diagnosis not present

## 2016-12-27 DIAGNOSIS — H8110 Benign paroxysmal vertigo, unspecified ear: Secondary | ICD-10-CM | POA: Diagnosis not present

## 2016-12-30 DIAGNOSIS — N400 Enlarged prostate without lower urinary tract symptoms: Secondary | ICD-10-CM | POA: Diagnosis not present

## 2016-12-30 DIAGNOSIS — Z6827 Body mass index (BMI) 27.0-27.9, adult: Secondary | ICD-10-CM | POA: Diagnosis not present

## 2016-12-30 DIAGNOSIS — G309 Alzheimer's disease, unspecified: Secondary | ICD-10-CM | POA: Diagnosis not present

## 2016-12-30 DIAGNOSIS — I10 Essential (primary) hypertension: Secondary | ICD-10-CM | POA: Diagnosis not present

## 2016-12-30 DIAGNOSIS — E785 Hyperlipidemia, unspecified: Secondary | ICD-10-CM | POA: Diagnosis not present

## 2016-12-30 DIAGNOSIS — Z23 Encounter for immunization: Secondary | ICD-10-CM | POA: Diagnosis not present

## 2016-12-30 DIAGNOSIS — R42 Dizziness and giddiness: Secondary | ICD-10-CM | POA: Diagnosis not present

## 2016-12-30 DIAGNOSIS — Z79899 Other long term (current) drug therapy: Secondary | ICD-10-CM | POA: Diagnosis not present

## 2017-02-07 DIAGNOSIS — N401 Enlarged prostate with lower urinary tract symptoms: Secondary | ICD-10-CM | POA: Diagnosis not present

## 2017-02-07 DIAGNOSIS — Z125 Encounter for screening for malignant neoplasm of prostate: Secondary | ICD-10-CM | POA: Diagnosis not present

## 2017-02-15 DIAGNOSIS — E663 Overweight: Secondary | ICD-10-CM | POA: Diagnosis not present

## 2017-02-15 DIAGNOSIS — N400 Enlarged prostate without lower urinary tract symptoms: Secondary | ICD-10-CM | POA: Diagnosis not present

## 2017-02-15 DIAGNOSIS — E785 Hyperlipidemia, unspecified: Secondary | ICD-10-CM | POA: Diagnosis not present

## 2017-02-15 DIAGNOSIS — G309 Alzheimer's disease, unspecified: Secondary | ICD-10-CM | POA: Diagnosis not present

## 2017-02-15 DIAGNOSIS — Z79899 Other long term (current) drug therapy: Secondary | ICD-10-CM | POA: Diagnosis not present

## 2017-02-15 DIAGNOSIS — E538 Deficiency of other specified B group vitamins: Secondary | ICD-10-CM | POA: Diagnosis not present

## 2017-02-15 DIAGNOSIS — Z6827 Body mass index (BMI) 27.0-27.9, adult: Secondary | ICD-10-CM | POA: Diagnosis not present

## 2017-02-15 DIAGNOSIS — I10 Essential (primary) hypertension: Secondary | ICD-10-CM | POA: Diagnosis not present

## 2017-02-15 DIAGNOSIS — R42 Dizziness and giddiness: Secondary | ICD-10-CM | POA: Diagnosis not present

## 2017-02-26 DIAGNOSIS — J069 Acute upper respiratory infection, unspecified: Secondary | ICD-10-CM | POA: Diagnosis not present

## 2017-05-02 DIAGNOSIS — I10 Essential (primary) hypertension: Secondary | ICD-10-CM | POA: Diagnosis not present

## 2017-05-02 DIAGNOSIS — G309 Alzheimer's disease, unspecified: Secondary | ICD-10-CM | POA: Diagnosis not present

## 2017-05-02 DIAGNOSIS — N400 Enlarged prostate without lower urinary tract symptoms: Secondary | ICD-10-CM | POA: Diagnosis not present

## 2017-05-02 DIAGNOSIS — Z6826 Body mass index (BMI) 26.0-26.9, adult: Secondary | ICD-10-CM | POA: Diagnosis not present

## 2017-05-02 DIAGNOSIS — Z79899 Other long term (current) drug therapy: Secondary | ICD-10-CM | POA: Diagnosis not present

## 2017-05-02 DIAGNOSIS — E785 Hyperlipidemia, unspecified: Secondary | ICD-10-CM | POA: Diagnosis not present

## 2017-06-07 DIAGNOSIS — E785 Hyperlipidemia, unspecified: Secondary | ICD-10-CM | POA: Diagnosis not present

## 2017-08-08 DIAGNOSIS — R351 Nocturia: Secondary | ICD-10-CM | POA: Diagnosis not present

## 2017-08-08 DIAGNOSIS — Z7689 Persons encountering health services in other specified circumstances: Secondary | ICD-10-CM | POA: Diagnosis not present

## 2017-08-08 DIAGNOSIS — N401 Enlarged prostate with lower urinary tract symptoms: Secondary | ICD-10-CM | POA: Diagnosis not present

## 2017-08-19 DIAGNOSIS — Z125 Encounter for screening for malignant neoplasm of prostate: Secondary | ICD-10-CM | POA: Diagnosis not present

## 2017-08-19 DIAGNOSIS — E785 Hyperlipidemia, unspecified: Secondary | ICD-10-CM | POA: Diagnosis not present

## 2017-08-19 DIAGNOSIS — Z0000 Encounter for general adult medical examination without abnormal findings: Secondary | ICD-10-CM | POA: Diagnosis not present

## 2017-08-19 DIAGNOSIS — Z1331 Encounter for screening for depression: Secondary | ICD-10-CM | POA: Diagnosis not present

## 2017-08-19 DIAGNOSIS — Z139 Encounter for screening, unspecified: Secondary | ICD-10-CM | POA: Diagnosis not present

## 2017-08-19 DIAGNOSIS — Z9181 History of falling: Secondary | ICD-10-CM | POA: Diagnosis not present

## 2017-08-19 DIAGNOSIS — Z1339 Encounter for screening examination for other mental health and behavioral disorders: Secondary | ICD-10-CM | POA: Diagnosis not present

## 2017-09-01 DIAGNOSIS — Z9181 History of falling: Secondary | ICD-10-CM | POA: Diagnosis not present

## 2017-09-01 DIAGNOSIS — Z79899 Other long term (current) drug therapy: Secondary | ICD-10-CM | POA: Diagnosis not present

## 2017-09-01 DIAGNOSIS — I10 Essential (primary) hypertension: Secondary | ICD-10-CM | POA: Diagnosis not present

## 2017-09-01 DIAGNOSIS — G309 Alzheimer's disease, unspecified: Secondary | ICD-10-CM | POA: Diagnosis not present

## 2017-09-01 DIAGNOSIS — N400 Enlarged prostate without lower urinary tract symptoms: Secondary | ICD-10-CM | POA: Diagnosis not present

## 2017-09-01 DIAGNOSIS — Z1331 Encounter for screening for depression: Secondary | ICD-10-CM | POA: Diagnosis not present

## 2017-09-01 DIAGNOSIS — E785 Hyperlipidemia, unspecified: Secondary | ICD-10-CM | POA: Diagnosis not present

## 2017-09-01 DIAGNOSIS — Z6826 Body mass index (BMI) 26.0-26.9, adult: Secondary | ICD-10-CM | POA: Diagnosis not present

## 2017-09-01 DIAGNOSIS — E663 Overweight: Secondary | ICD-10-CM | POA: Diagnosis not present

## 2017-11-07 DIAGNOSIS — R238 Other skin changes: Secondary | ICD-10-CM | POA: Diagnosis not present

## 2017-11-07 DIAGNOSIS — B029 Zoster without complications: Secondary | ICD-10-CM | POA: Diagnosis not present

## 2017-11-07 DIAGNOSIS — R1032 Left lower quadrant pain: Secondary | ICD-10-CM | POA: Diagnosis not present

## 2017-11-07 DIAGNOSIS — R109 Unspecified abdominal pain: Secondary | ICD-10-CM | POA: Diagnosis not present

## 2017-11-07 DIAGNOSIS — Z6826 Body mass index (BMI) 26.0-26.9, adult: Secondary | ICD-10-CM | POA: Diagnosis not present

## 2017-11-07 DIAGNOSIS — R1031 Right lower quadrant pain: Secondary | ICD-10-CM | POA: Diagnosis not present

## 2017-11-07 DIAGNOSIS — N401 Enlarged prostate with lower urinary tract symptoms: Secondary | ICD-10-CM | POA: Diagnosis not present

## 2017-12-21 DIAGNOSIS — J309 Allergic rhinitis, unspecified: Secondary | ICD-10-CM | POA: Diagnosis not present

## 2017-12-21 DIAGNOSIS — J209 Acute bronchitis, unspecified: Secondary | ICD-10-CM | POA: Diagnosis not present

## 2017-12-26 DIAGNOSIS — J209 Acute bronchitis, unspecified: Secondary | ICD-10-CM | POA: Diagnosis not present

## 2017-12-26 DIAGNOSIS — Z79899 Other long term (current) drug therapy: Secondary | ICD-10-CM | POA: Diagnosis not present

## 2017-12-26 DIAGNOSIS — Z6826 Body mass index (BMI) 26.0-26.9, adult: Secondary | ICD-10-CM | POA: Diagnosis not present

## 2017-12-26 DIAGNOSIS — J45909 Unspecified asthma, uncomplicated: Secondary | ICD-10-CM | POA: Diagnosis not present

## 2018-01-02 DIAGNOSIS — Z79899 Other long term (current) drug therapy: Secondary | ICD-10-CM | POA: Diagnosis not present

## 2018-01-02 DIAGNOSIS — E785 Hyperlipidemia, unspecified: Secondary | ICD-10-CM | POA: Diagnosis not present

## 2018-01-02 DIAGNOSIS — Z6827 Body mass index (BMI) 27.0-27.9, adult: Secondary | ICD-10-CM | POA: Diagnosis not present

## 2018-01-02 DIAGNOSIS — Z23 Encounter for immunization: Secondary | ICD-10-CM | POA: Diagnosis not present

## 2018-01-02 DIAGNOSIS — R05 Cough: Secondary | ICD-10-CM | POA: Diagnosis not present

## 2018-01-02 DIAGNOSIS — I10 Essential (primary) hypertension: Secondary | ICD-10-CM | POA: Diagnosis not present

## 2018-01-02 DIAGNOSIS — N400 Enlarged prostate without lower urinary tract symptoms: Secondary | ICD-10-CM | POA: Diagnosis not present

## 2018-01-02 DIAGNOSIS — G309 Alzheimer's disease, unspecified: Secondary | ICD-10-CM | POA: Diagnosis not present

## 2018-01-17 DIAGNOSIS — D72829 Elevated white blood cell count, unspecified: Secondary | ICD-10-CM | POA: Diagnosis not present

## 2018-02-09 DIAGNOSIS — N401 Enlarged prostate with lower urinary tract symptoms: Secondary | ICD-10-CM | POA: Diagnosis not present

## 2018-02-09 DIAGNOSIS — R972 Elevated prostate specific antigen [PSA]: Secondary | ICD-10-CM | POA: Diagnosis not present

## 2018-02-09 DIAGNOSIS — R351 Nocturia: Secondary | ICD-10-CM | POA: Diagnosis not present

## 2018-05-04 DIAGNOSIS — G309 Alzheimer's disease, unspecified: Secondary | ICD-10-CM | POA: Diagnosis not present

## 2018-05-04 DIAGNOSIS — E785 Hyperlipidemia, unspecified: Secondary | ICD-10-CM | POA: Diagnosis not present

## 2018-05-04 DIAGNOSIS — E663 Overweight: Secondary | ICD-10-CM | POA: Diagnosis not present

## 2018-05-04 DIAGNOSIS — N400 Enlarged prostate without lower urinary tract symptoms: Secondary | ICD-10-CM | POA: Diagnosis not present

## 2018-05-04 DIAGNOSIS — Z79899 Other long term (current) drug therapy: Secondary | ICD-10-CM | POA: Diagnosis not present

## 2018-05-04 DIAGNOSIS — I10 Essential (primary) hypertension: Secondary | ICD-10-CM | POA: Diagnosis not present

## 2018-07-02 DIAGNOSIS — R0902 Hypoxemia: Secondary | ICD-10-CM | POA: Diagnosis not present

## 2018-07-02 DIAGNOSIS — R509 Fever, unspecified: Secondary | ICD-10-CM | POA: Diagnosis not present

## 2018-07-02 DIAGNOSIS — R52 Pain, unspecified: Secondary | ICD-10-CM | POA: Diagnosis not present

## 2018-07-02 DIAGNOSIS — N3000 Acute cystitis without hematuria: Secondary | ICD-10-CM | POA: Diagnosis not present

## 2018-07-02 DIAGNOSIS — R531 Weakness: Secondary | ICD-10-CM | POA: Diagnosis not present

## 2018-07-02 DIAGNOSIS — N2889 Other specified disorders of kidney and ureter: Secondary | ICD-10-CM | POA: Diagnosis not present

## 2018-07-02 DIAGNOSIS — M5489 Other dorsalgia: Secondary | ICD-10-CM | POA: Diagnosis not present

## 2018-07-02 DIAGNOSIS — Z209 Contact with and (suspected) exposure to unspecified communicable disease: Secondary | ICD-10-CM | POA: Diagnosis not present

## 2018-07-03 DIAGNOSIS — I10 Essential (primary) hypertension: Secondary | ICD-10-CM | POA: Diagnosis not present

## 2018-07-03 DIAGNOSIS — E871 Hypo-osmolality and hyponatremia: Secondary | ICD-10-CM | POA: Diagnosis not present

## 2018-07-03 DIAGNOSIS — F0390 Unspecified dementia without behavioral disturbance: Secondary | ICD-10-CM | POA: Diagnosis not present

## 2018-07-03 DIAGNOSIS — A419 Sepsis, unspecified organism: Secondary | ICD-10-CM | POA: Diagnosis not present

## 2018-07-03 DIAGNOSIS — R509 Fever, unspecified: Secondary | ICD-10-CM | POA: Diagnosis not present

## 2018-07-03 DIAGNOSIS — M1990 Unspecified osteoarthritis, unspecified site: Secondary | ICD-10-CM | POA: Diagnosis not present

## 2018-07-03 DIAGNOSIS — Z87891 Personal history of nicotine dependence: Secondary | ICD-10-CM | POA: Diagnosis not present

## 2018-07-03 DIAGNOSIS — N2889 Other specified disorders of kidney and ureter: Secondary | ICD-10-CM | POA: Diagnosis not present

## 2018-07-03 DIAGNOSIS — R531 Weakness: Secondary | ICD-10-CM | POA: Diagnosis not present

## 2018-07-03 DIAGNOSIS — N3000 Acute cystitis without hematuria: Secondary | ICD-10-CM | POA: Diagnosis not present

## 2018-07-03 DIAGNOSIS — R079 Chest pain, unspecified: Secondary | ICD-10-CM | POA: Diagnosis not present

## 2018-07-03 DIAGNOSIS — K5730 Diverticulosis of large intestine without perforation or abscess without bleeding: Secondary | ICD-10-CM | POA: Diagnosis not present

## 2018-07-03 DIAGNOSIS — N390 Urinary tract infection, site not specified: Secondary | ICD-10-CM | POA: Diagnosis not present

## 2018-07-03 DIAGNOSIS — K760 Fatty (change of) liver, not elsewhere classified: Secondary | ICD-10-CM | POA: Diagnosis not present

## 2018-07-03 DIAGNOSIS — Z7982 Long term (current) use of aspirin: Secondary | ICD-10-CM | POA: Diagnosis not present

## 2018-07-03 DIAGNOSIS — C642 Malignant neoplasm of left kidney, except renal pelvis: Secondary | ICD-10-CM | POA: Diagnosis not present

## 2018-07-11 DIAGNOSIS — A419 Sepsis, unspecified organism: Secondary | ICD-10-CM | POA: Diagnosis not present

## 2018-07-11 DIAGNOSIS — Z79899 Other long term (current) drug therapy: Secondary | ICD-10-CM | POA: Diagnosis not present

## 2018-07-11 DIAGNOSIS — N2889 Other specified disorders of kidney and ureter: Secondary | ICD-10-CM | POA: Diagnosis not present

## 2018-07-11 DIAGNOSIS — Z6826 Body mass index (BMI) 26.0-26.9, adult: Secondary | ICD-10-CM | POA: Diagnosis not present

## 2018-08-01 DIAGNOSIS — N401 Enlarged prostate with lower urinary tract symptoms: Secondary | ICD-10-CM | POA: Diagnosis not present

## 2018-08-01 DIAGNOSIS — N2889 Other specified disorders of kidney and ureter: Secondary | ICD-10-CM | POA: Diagnosis not present

## 2018-08-01 DIAGNOSIS — R972 Elevated prostate specific antigen [PSA]: Secondary | ICD-10-CM | POA: Diagnosis not present

## 2018-08-11 DIAGNOSIS — F0391 Unspecified dementia with behavioral disturbance: Secondary | ICD-10-CM | POA: Diagnosis not present

## 2018-08-11 DIAGNOSIS — Z808 Family history of malignant neoplasm of other organs or systems: Secondary | ICD-10-CM | POA: Diagnosis not present

## 2018-08-11 DIAGNOSIS — N2889 Other specified disorders of kidney and ureter: Secondary | ICD-10-CM | POA: Diagnosis not present

## 2018-08-18 IMAGING — CR DG CHEST 2V
2 series · 2 of 2 positions shown · non-contrast
Comparison: Radiograph February 18, 2015.

CLINICAL DATA: Preop for knee surgery.

EXAM:
CHEST  2 VIEW

[w chest pa]
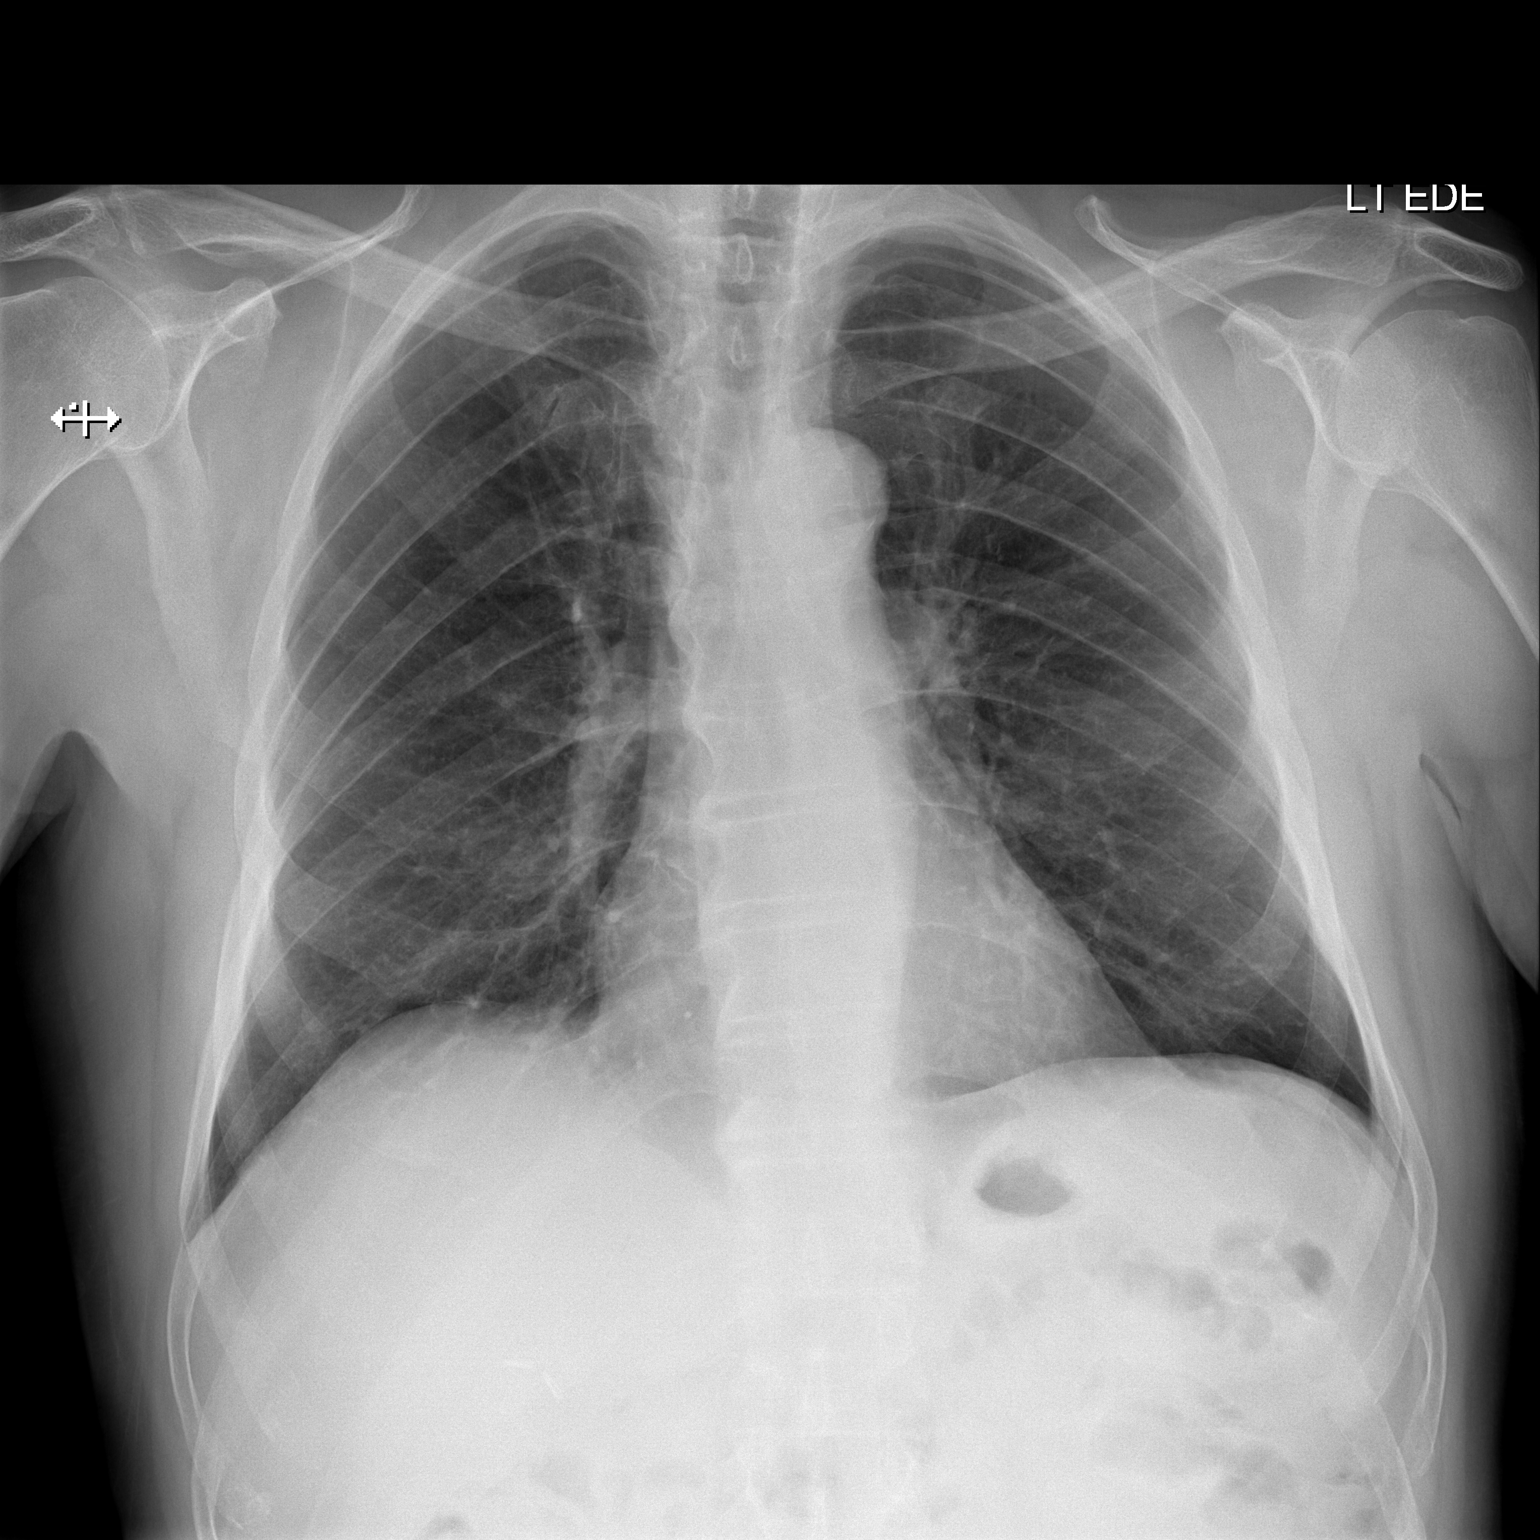

[w chest lat]
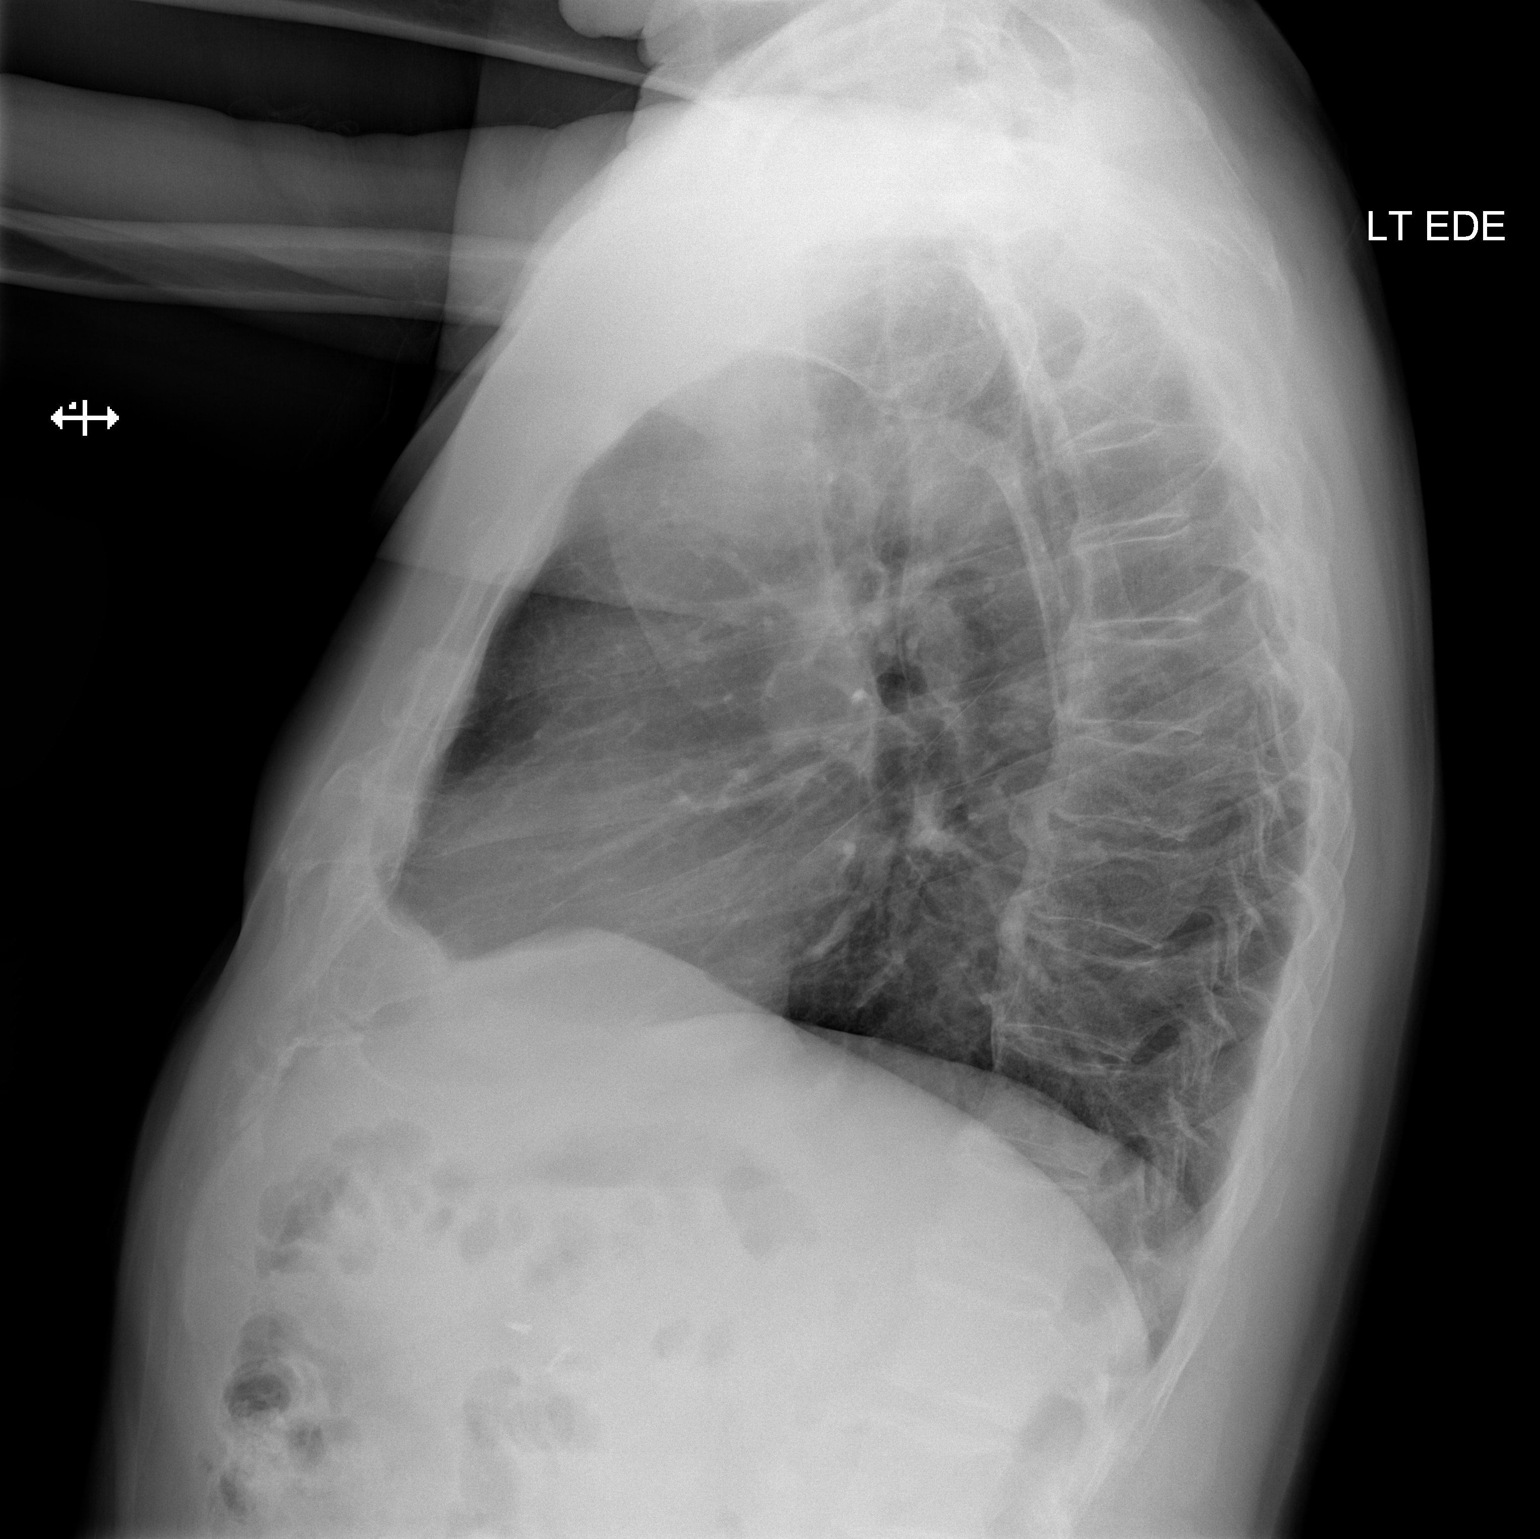

[2 of 2 positions shown; findings below may reference images not displayed]

FINDINGS: The heart size and mediastinal contours are within normal limits.
Both lungs are clear. No pneumothorax or pleural effusion is noted.
The visualized skeletal structures are unremarkable.
IMPRESSION: No active cardiopulmonary disease.

## 2018-08-25 DIAGNOSIS — Z1331 Encounter for screening for depression: Secondary | ICD-10-CM | POA: Diagnosis not present

## 2018-08-25 DIAGNOSIS — E785 Hyperlipidemia, unspecified: Secondary | ICD-10-CM | POA: Diagnosis not present

## 2018-08-25 DIAGNOSIS — Z9181 History of falling: Secondary | ICD-10-CM | POA: Diagnosis not present

## 2018-08-25 DIAGNOSIS — Z139 Encounter for screening, unspecified: Secondary | ICD-10-CM | POA: Diagnosis not present

## 2018-08-25 DIAGNOSIS — Z0000 Encounter for general adult medical examination without abnormal findings: Secondary | ICD-10-CM | POA: Diagnosis not present

## 2018-09-29 ENCOUNTER — Other Ambulatory Visit (HOSPITAL_COMMUNITY): Payer: Self-pay | Admitting: Interventional Radiology

## 2018-09-29 DIAGNOSIS — N2889 Other specified disorders of kidney and ureter: Secondary | ICD-10-CM

## 2018-10-10 NOTE — Progress Notes (Signed)
Please provide surgery orders. Pt is scheduled for PAT appointment tomorrow.

## 2018-10-10 NOTE — Patient Instructions (Addendum)
DUE TO COVID-19 ONLY ONE VISITOR IS ALLOWED TO COME WITH YOU AND STAY IN THE WAITING ROOM ONLY DURING PRE OP AND PROCEDURE DAY OF SURGERY. THE 1 VISITOR MAY VISIT WITH YOU AFTER SURGERY IN YOUR PRIVATE ROOM DURING VISITING HOURS ONLY!  YOU NEED TO HAVE A COVID 19 TEST ON 10-14-18 @ 10:05 AM, THIS TEST MUST BE DONE BEFORE SURGERY, COME  Javier Hall, Javier Hall , [id number].  ([place]) ONCE YOUR COVID TEST IS COMPLETED, PLEASE BEGIN THE QUARANTINE INSTRUCTIONS AS OUTLINED IN YOUR HANDOUT.                Javier Hall  10/10/2018   Your procedure is scheduled on: 10-18-18    Report to [facility] Main  Entrance    Report to Admitting at 6:30 AM     Call this number if you have problems the morning of surgery [phone]    Remember: Do not eat food or drink liquids :After Midnight.     Take these medicines the morning of surgery with A SIP OF WATER: Donepezil (Aricept), Finasteride (Proscar), Tamsulosin (Flomax) and Memantine   BRUSH YOUR TEETH MORNING OF SURGERY AND RINSE YOUR MOUTH OUT, NO CHEWING GUM CANDY OR MINTS.                                You may not have any metal on your body including hair pins and              piercings     Do not wear jewelry, make-up, lotions, powders or perfumes, deodorant                        Men may shave face and neck.   Do not bring valuables to the hospital. [place].  Contacts, dentures or bridgework may not be worn into surgery.  Leave suitcase in the car. After surgery it may be brought to your room.     Special Instructions: N/A              Please read over the following fact sheets you were given: _____________________________________________________________________             [facility] - Preparing for Surgery Before surgery, you can play an important role.  Because skin is not sterile, your skin needs to be as free of germs as possible.   You can reduce the number of germs on your skin by washing with CHG (chlorahexidine gluconate) soap before surgery.  CHG is an antiseptic cleaner which kills germs and bonds with the skin to continue killing germs even after washing. Please DO NOT use if you have an allergy to CHG or antibacterial soaps.  If your skin becomes reddened/irritated stop using the CHG and inform your nurse when you arrive at Short Stay. Do not shave (including legs and underarms) for at least 48 hours prior to the first CHG shower.  You may shave your face/neck. Please follow these instructions carefully:  1.  Shower with CHG Soap the night before surgery and the  morning of Surgery.  2.  If you choose to wash your hair, wash your hair first as usual with your  normal  shampoo.  3.  After you shampoo, rinse your hair and  body thoroughly to remove the  shampoo.                           4.  Use CHG as you would any other liquid soap.  You can apply chg directly  to the skin and wash                       Gently with a scrungie or clean washcloth.  5.  Apply the CHG Soap to your body ONLY FROM THE NECK DOWN.   Do not use on face/ open                           Wound or open sores. Avoid contact with eyes, ears mouth and genitals (private parts).                       Wash face,  Genitals (private parts) with your normal soap.             6.  Wash thoroughly, paying special attention to the area where your surgery  will be performed.  7.  Thoroughly rinse your body with warm water from the neck down.  8.  DO NOT shower/wash with your normal soap after using and rinsing off  the CHG Soap.                9.  Pat yourself dry with a clean towel.            10.  Wear clean pajamas.            11.  Place clean sheets on your bed the night of your first shower and do not  sleep with pets. Day of Surgery : Do not apply any lotions/deodorants the morning of surgery.  Please wear clean clothes to the hospital/surgery  center.  FAILURE TO FOLLOW THESE INSTRUCTIONS MAY RESULT IN THE CANCELLATION OF YOUR SURGERY PATIENT SIGNATURE_________________________________  NURSE SIGNATURE__________________________________  ________________________________________________________________________

## 2018-10-11 ENCOUNTER — Encounter (HOSPITAL_COMMUNITY)
Admission: RE | Admit: 2018-10-11 | Discharge: 2018-10-11 | Disposition: A | Discharge status: Home / Self Care | Payer: PPO | Source: Ambulatory Visit | Attending: Interventional Radiology | Admitting: Interventional Radiology

## 2018-10-11 ENCOUNTER — Encounter (INDEPENDENT_AMBULATORY_CARE_PROVIDER_SITE_OTHER): Payer: Self-pay

## 2018-10-11 ENCOUNTER — Encounter (HOSPITAL_COMMUNITY): Payer: Self-pay

## 2018-10-11 ENCOUNTER — Other Ambulatory Visit: Payer: Self-pay

## 2018-10-11 DIAGNOSIS — R9431 Abnormal electrocardiogram [ECG] [EKG]: Secondary | ICD-10-CM | POA: Insufficient documentation

## 2018-10-11 DIAGNOSIS — Z01818 Encounter for other preprocedural examination: Secondary | ICD-10-CM | POA: Insufficient documentation

## 2018-10-11 DIAGNOSIS — N2889 Other specified disorders of kidney and ureter: Secondary | ICD-10-CM | POA: Diagnosis not present

## 2018-10-11 LAB — CBC
HCT: 45 % (ref 39.0–52.0)
Hemoglobin: 14.9 g/dL (ref 13.0–17.0)
MCH: 30.2 pg (ref 26.0–34.0)
MCHC: 33.1 g/dL (ref 30.0–36.0)
MCV: 91.3 fL (ref 80.0–100.0)
Platelets: 264 10*3/uL (ref 150–400)
RBC: 4.93 MIL/uL (ref 4.22–5.81)
RDW: 13.7 % (ref 11.5–15.5)
WBC: 8.9 10*3/uL (ref 4.0–10.5)
nRBC: 0 % (ref 0.0–0.2)

## 2018-10-11 LAB — BASIC METABOLIC PANEL
Anion gap: 7 (ref 5–15)
BUN: 19 mg/dL (ref 8–23)
CO2: 24 mmol/L (ref 22–32)
Calcium: 9.3 mg/dL (ref 8.9–10.3)
Chloride: 107 mmol/L (ref 98–111)
Creatinine, Ser: 1.02 mg/dL (ref 0.61–1.24)
GFR calc Af Amer: >60 mL/min (ref >60)
GFR calc non Af Amer: >60 mL/min (ref >60)
Glucose, Bld: 108 mg/dL — ABNORMAL HIGH (ref 70–99)
Potassium: 4.7 mmol/L (ref 3.5–5.1)
Sodium: 138 mmol/L (ref 135–145)

## 2018-10-14 ENCOUNTER — Other Ambulatory Visit (HOSPITAL_COMMUNITY)
Admission: RE | Admit: 2018-10-14 | Discharge: 2018-10-14 | Disposition: A | Discharge status: Home / Self Care | Payer: PPO | Source: Ambulatory Visit | Attending: Urology | Admitting: Urology

## 2018-10-14 DIAGNOSIS — Z01812 Encounter for preprocedural laboratory examination: Secondary | ICD-10-CM | POA: Insufficient documentation

## 2018-10-14 DIAGNOSIS — Z20828 Contact with and (suspected) exposure to other viral communicable diseases: Secondary | ICD-10-CM | POA: Diagnosis not present

## 2018-10-16 LAB — NOVEL CORONAVIRUS, NAA (HOSP ORDER, SEND-OUT TO REF LAB; TAT 18-24 HRS): SARS-CoV-2, NAA: NOT DETECTED

## 2018-10-17 ENCOUNTER — Other Ambulatory Visit: Payer: Self-pay | Admitting: Radiology

## 2018-10-17 NOTE — Anesthesia Preprocedure Evaluation (Addendum)
Anesthesia Evaluation  Patient identified by MRN, date of birth, ID band Patient awake    Reviewed: Allergy & Precautions, NPO status , Patient's Chart, lab work & pertinent test results  History of Anesthesia Complications (+) PONV and history of anesthetic complications  Airway Mallampati: II  TM Distance: >3 FB Neck ROM: Full    Dental no notable dental hx. (+) Dental Advisory Given   Pulmonary neg pulmonary ROS,    Pulmonary exam normal        Cardiovascular hypertension, Normal cardiovascular exam     Neuro/Psych PSYCHIATRIC DISORDERS Dementia negative neurological ROS     GI/Hepatic negative GI ROS, Neg liver ROS,   Endo/Other  negative endocrine ROS  Renal/GU negative Renal ROSRenal mass  negative genitourinary   Musculoskeletal negative musculoskeletal ROS (+)   Abdominal   Peds negative pediatric ROS (+)  Hematology negative hematology ROS (+)   Anesthesia Other Findings   Reproductive/Obstetrics negative OB ROS                            Anesthesia Physical Anesthesia Plan  ASA: III  Anesthesia Plan: General   Post-op Pain Management:    Induction: Intravenous  PONV Risk Score and Plan: 3 and Ondansetron, Dexamethasone and Treatment may vary due to age or medical condition  Airway Management Planned: Oral ETT  Additional Equipment:   Intra-op Plan:   Post-operative Plan: Extubation in OR  Informed Consent: I have reviewed the patients History and Physical, chart, labs and discussed the procedure including the risks, benefits and alternatives for the proposed anesthesia with the patient or authorized representative who has indicated his/her understanding and acceptance.     Dental advisory given  Plan Discussed with: Anesthesiologist and CRNA  Anesthesia Plan Comments:        Anesthesia Quick Evaluation

## 2018-10-18 ENCOUNTER — Other Ambulatory Visit: Payer: Self-pay

## 2018-10-18 ENCOUNTER — Encounter (HOSPITAL_COMMUNITY)
Admission: RE | Disposition: A | Discharge status: Home / Self Care | Payer: Self-pay | Source: Other Acute Inpatient Hospital | Attending: Interventional Radiology

## 2018-10-18 ENCOUNTER — Ambulatory Visit (HOSPITAL_COMMUNITY): Payer: PPO | Admitting: Anesthesiology

## 2018-10-18 ENCOUNTER — Observation Stay (HOSPITAL_COMMUNITY)
Admission: RE | Admit: 2018-10-18 | Discharge: 2018-10-19 | Disposition: A | Discharge status: Home / Self Care | Payer: PPO | Source: Other Acute Inpatient Hospital | Attending: Interventional Radiology | Admitting: Interventional Radiology

## 2018-10-18 ENCOUNTER — Encounter (HOSPITAL_COMMUNITY): Payer: Self-pay | Admitting: *Deleted

## 2018-10-18 ENCOUNTER — Ambulatory Visit (HOSPITAL_COMMUNITY)
Admission: RE | Admit: 2018-10-18 | Discharge: 2018-10-18 | Disposition: A | Discharge status: Home / Self Care | Payer: PPO | Source: Ambulatory Visit | Attending: Interventional Radiology | Admitting: Interventional Radiology

## 2018-10-18 ENCOUNTER — Ambulatory Visit (HOSPITAL_COMMUNITY): Payer: PPO | Admitting: Physician Assistant

## 2018-10-18 DIAGNOSIS — Z9049 Acquired absence of other specified parts of digestive tract: Secondary | ICD-10-CM | POA: Diagnosis not present

## 2018-10-18 DIAGNOSIS — Z96659 Presence of unspecified artificial knee joint: Secondary | ICD-10-CM | POA: Diagnosis not present

## 2018-10-18 DIAGNOSIS — Z96652 Presence of left artificial knee joint: Secondary | ICD-10-CM | POA: Insufficient documentation

## 2018-10-18 DIAGNOSIS — Z79899 Other long term (current) drug therapy: Secondary | ICD-10-CM | POA: Insufficient documentation

## 2018-10-18 DIAGNOSIS — C642 Malignant neoplasm of left kidney, except renal pelvis: Secondary | ICD-10-CM | POA: Diagnosis not present

## 2018-10-18 DIAGNOSIS — N400 Enlarged prostate without lower urinary tract symptoms: Secondary | ICD-10-CM | POA: Diagnosis not present

## 2018-10-18 DIAGNOSIS — I493 Ventricular premature depolarization: Secondary | ICD-10-CM | POA: Diagnosis not present

## 2018-10-18 DIAGNOSIS — K5790 Diverticulosis of intestine, part unspecified, without perforation or abscess without bleeding: Secondary | ICD-10-CM | POA: Diagnosis not present

## 2018-10-18 DIAGNOSIS — Z87442 Personal history of urinary calculi: Secondary | ICD-10-CM | POA: Diagnosis not present

## 2018-10-18 DIAGNOSIS — N2889 Other specified disorders of kidney and ureter: Secondary | ICD-10-CM

## 2018-10-18 DIAGNOSIS — Z885 Allergy status to narcotic agent status: Secondary | ICD-10-CM | POA: Insufficient documentation

## 2018-10-18 DIAGNOSIS — I10 Essential (primary) hypertension: Secondary | ICD-10-CM | POA: Diagnosis not present

## 2018-10-18 DIAGNOSIS — M1990 Unspecified osteoarthritis, unspecified site: Secondary | ICD-10-CM | POA: Diagnosis not present

## 2018-10-18 DIAGNOSIS — Z7982 Long term (current) use of aspirin: Secondary | ICD-10-CM | POA: Insufficient documentation

## 2018-10-18 DIAGNOSIS — F0390 Unspecified dementia without behavioral disturbance: Secondary | ICD-10-CM | POA: Diagnosis not present

## 2018-10-18 DIAGNOSIS — D49512 Neoplasm of unspecified behavior of left kidney: Secondary | ICD-10-CM | POA: Diagnosis not present

## 2018-10-18 HISTORY — DX: Malignant neoplasm of left kidney, except renal pelvis: C64.2

## 2018-10-18 HISTORY — PX: RADIOFREQUENCY ABLATION: SHX2290

## 2018-10-18 LAB — COMPREHENSIVE METABOLIC PANEL
ALT: 27 U/L (ref 0–44)
AST: 21 U/L (ref 15–41)
Albumin: 4.1 g/dL (ref 3.5–5.0)
Alkaline Phosphatase: 47 U/L (ref 38–126)
Anion gap: 10 (ref 5–15)
BUN: 19 mg/dL (ref 8–23)
CO2: 22 mmol/L (ref 22–32)
Calcium: 8.8 mg/dL — ABNORMAL LOW (ref 8.9–10.3)
Chloride: 107 mmol/L (ref 98–111)
Creatinine, Ser: 0.97 mg/dL (ref 0.61–1.24)
GFR calc Af Amer: >60 mL/min (ref >60)
GFR calc non Af Amer: >60 mL/min (ref >60)
Glucose, Bld: 112 mg/dL — ABNORMAL HIGH (ref 70–99)
Potassium: 4 mmol/L (ref 3.5–5.1)
Sodium: 139 mmol/L (ref 135–145)
Total Bilirubin: 0.9 mg/dL (ref 0.3–1.2)
Total Protein: 6.8 g/dL (ref 6.5–8.1)

## 2018-10-18 LAB — CBC
HCT: 42.4 % (ref 39.0–52.0)
Hemoglobin: 14 g/dL (ref 13.0–17.0)
MCH: 29.9 pg (ref 26.0–34.0)
MCHC: 33 g/dL (ref 30.0–36.0)
MCV: 90.6 fL (ref 80.0–100.0)
Platelets: 249 10*3/uL (ref 150–400)
RBC: 4.68 MIL/uL (ref 4.22–5.81)
RDW: 13.7 % (ref 11.5–15.5)
WBC: 8.8 10*3/uL (ref 4.0–10.5)
nRBC: 0 % (ref 0.0–0.2)

## 2018-10-18 LAB — TYPE AND SCREEN
ABO/RH(D): A POS
Antibody Screen: NEGATIVE

## 2018-10-18 LAB — PROTIME-INR
INR: 1 (ref 0.8–1.2)
Prothrombin Time: 12.9 seconds (ref 11.4–15.2)

## 2018-10-18 LAB — APTT: aPTT: 30 seconds (ref 24–36)

## 2018-10-18 SURGERY — RADIO FREQUENCY ABLATION
Anesthesia: General

## 2018-10-18 MED ORDER — FINASTERIDE 5 MG PO TABS
5.0000 mg | ORAL_TABLET | Freq: Every day | ORAL | Status: DC
  Administered 2018-10-19: 09:00:00 5 mg via ORAL
  Filled 2018-10-18: qty 1

## 2018-10-18 MED ORDER — LACTATED RINGERS IV SOLN
INTRAVENOUS | Status: DC
  Administered 2018-10-18 – 2018-10-19 (×4): via INTRAVENOUS

## 2018-10-18 MED ORDER — FENTANYL CITRATE (PF) 100 MCG/2ML IJ SOLN
25.0000 ug | INTRAMUSCULAR | Status: DC | PRN
  Administered 2018-10-18: 50 ug via INTRAVENOUS

## 2018-10-18 MED ORDER — FENTANYL CITRATE (PF) 100 MCG/2ML IJ SOLN
INTRAMUSCULAR | Status: AC
  Filled 2018-10-18: qty 2

## 2018-10-18 MED ORDER — PROMETHAZINE HCL 25 MG/ML IJ SOLN: 6.2500 mg | INTRAMUSCULAR | Status: DC | PRN

## 2018-10-18 MED ORDER — MEMANTINE HCL 10 MG PO TABS
10.0000 mg | ORAL_TABLET | Freq: Two times a day (BID) | ORAL | Status: DC
  Administered 2018-10-18 – 2018-10-19 (×2): 10 mg via ORAL
  Filled 2018-10-18 (×2): qty 1

## 2018-10-18 MED ORDER — ONDANSETRON HCL 4 MG/2ML IJ SOLN
INTRAMUSCULAR | Status: DC | PRN
  Administered 2018-10-18: 4 mg via INTRAVENOUS

## 2018-10-18 MED ORDER — PROPOFOL 10 MG/ML IV BOLUS
INTRAVENOUS | Status: DC | PRN
  Administered 2018-10-18: 160 mg via INTRAVENOUS

## 2018-10-18 MED ORDER — LISINOPRIL 20 MG PO TABS
20.0000 mg | ORAL_TABLET | Freq: Every day | ORAL | Status: DC
  Administered 2018-10-18 – 2018-10-19 (×2): 20 mg via ORAL
  Filled 2018-10-18 (×2): qty 1

## 2018-10-18 MED ORDER — LIDOCAINE 2% (20 MG/ML) 5 ML SYRINGE
INTRAMUSCULAR | Status: DC | PRN
  Administered 2018-10-18: 50 mg via INTRAVENOUS

## 2018-10-18 MED ORDER — HYDROMORPHONE HCL 2 MG/ML IJ SOLN
INTRAMUSCULAR | Status: AC
  Filled 2018-10-18: qty 1

## 2018-10-18 MED ORDER — SODIUM CHLORIDE (PF) 0.9 % IJ SOLN
INTRAMUSCULAR | Status: AC
  Filled 2018-10-18: qty 150

## 2018-10-18 MED ORDER — ONDANSETRON HCL 4 MG/2ML IJ SOLN
4.0000 mg | Freq: Four times a day (QID) | INTRAMUSCULAR | Status: DC | PRN
  Administered 2018-10-18: 4 mg via INTRAVENOUS
  Filled 2018-10-18: qty 2

## 2018-10-18 MED ORDER — CEFAZOLIN SODIUM-DEXTROSE 2-4 GM/100ML-% IV SOLN
2.0000 g | Freq: Once | INTRAVENOUS | Status: AC
  Administered 2018-10-18: 2 g via INTRAVENOUS
  Filled 2018-10-18: qty 100

## 2018-10-18 MED ORDER — SUGAMMADEX SODIUM 200 MG/2ML IV SOLN
INTRAVENOUS | Status: DC | PRN
  Administered 2018-10-18: 170 mg via INTRAVENOUS

## 2018-10-18 MED ORDER — TAMSULOSIN HCL 0.4 MG PO CAPS
0.4000 mg | ORAL_CAPSULE | Freq: Two times a day (BID) | ORAL | Status: DC
  Administered 2018-10-18 – 2018-10-19 (×2): 0.4 mg via ORAL
  Filled 2018-10-18 (×2): qty 1

## 2018-10-18 MED ORDER — ACETAMINOPHEN 500 MG PO TABS
ORAL_TABLET | ORAL | Status: AC
  Filled 2018-10-18: qty 2

## 2018-10-18 MED ORDER — ROCURONIUM BROMIDE 10 MG/ML (PF) SYRINGE
PREFILLED_SYRINGE | INTRAVENOUS | Status: DC | PRN
  Administered 2018-10-18: 10 mg via INTRAVENOUS
  Administered 2018-10-18: 70 mg via INTRAVENOUS

## 2018-10-18 MED ORDER — DEXAMETHASONE SODIUM PHOSPHATE 10 MG/ML IJ SOLN
INTRAMUSCULAR | Status: DC | PRN
  Administered 2018-10-18: 8 mg via INTRAVENOUS

## 2018-10-18 MED ORDER — DONEPEZIL HCL 10 MG PO TABS
10.0000 mg | ORAL_TABLET | Freq: Every day | ORAL | Status: DC
  Administered 2018-10-19: 09:00:00 10 mg via ORAL
  Filled 2018-10-18: qty 1

## 2018-10-18 MED ORDER — IOHEXOL 300 MG/ML  SOLN
100.0000 mL | Freq: Once | INTRAMUSCULAR | Status: AC | PRN
  Administered 2018-10-18: 11:00:00 100 mL via INTRAVENOUS

## 2018-10-18 MED ORDER — OXYCODONE-ACETAMINOPHEN 5-325 MG PO TABS: 1.0000 | ORAL_TABLET | ORAL | Status: DC | PRN

## 2018-10-18 MED ORDER — FENTANYL CITRATE (PF) 100 MCG/2ML IJ SOLN
INTRAMUSCULAR | Status: DC | PRN
  Administered 2018-10-18 (×2): 50 ug via INTRAVENOUS

## 2018-10-18 MED ORDER — FENTANYL CITRATE (PF) 250 MCG/5ML IJ SOLN
INTRAMUSCULAR | Status: AC
  Filled 2018-10-18: qty 5

## 2018-10-18 MED ORDER — EPHEDRINE SULFATE-NACL 50-0.9 MG/10ML-% IV SOSY
PREFILLED_SYRINGE | INTRAVENOUS | Status: DC | PRN
  Administered 2018-10-18: 10 mg via INTRAVENOUS
  Administered 2018-10-18: 5 mg via INTRAVENOUS
  Administered 2018-10-18: 10 mg via INTRAVENOUS

## 2018-10-18 MED ORDER — ACETAMINOPHEN 500 MG PO TABS
1000.0000 mg | ORAL_TABLET | Freq: Once | ORAL | Status: AC
  Administered 2018-10-18: 08:00:00 1000 mg via ORAL

## 2018-10-18 MED ORDER — DOCUSATE SODIUM 100 MG PO CAPS
100.0000 mg | ORAL_CAPSULE | Freq: Two times a day (BID) | ORAL | Status: DC
  Administered 2018-10-18 – 2018-10-19 (×2): 100 mg via ORAL
  Filled 2018-10-18 (×2): qty 1

## 2018-10-18 NOTE — Progress Notes (Signed)
Patient ID: Javier Hall, male   DOB: 1935-11-18, 83 y.o.   MRN: [id number] Patient resting quietly in bed.  Stated he had some mild nausea earlier but now resolved.  Denies significant abdominal or flank pain, fever, chest pain, dyspnea, or bleeding.  Throat is slightly irritated from recent ET tube. Blood pressure 128/64, heart rate 79, temp 98.1, respirations 16, O2 sat 97%  2 liters N/C Patient awake, alert.  Chest clear to auscultation bilaterally; heart with normal rate, some ectopy noted.  Abdomen soft, positive bowel sounds, nontender.  Puncture site left flank clean, dry, nontender, no obvious hematoma.  No lower extremity edema.  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  For overnight obs.  Patient has had some recent  fluctuations in heart rate with ectopy/PVCs noted on EKG; he is currently asymptomatic and has no known cardiac history.  Will keep patient on telemetry.  Check a.m. labs, follow-up with Dr. Laurence Ferrari in [place] clinic either via phone call or in person in 3 to 4 weeks.  Follow-up imaging in  3 months.  Javier Hall, [facility]

## 2018-10-18 NOTE — H&P (Signed)
Referring Physician(s): Chao,R  Supervising Physician: Jacqulynn Cadet  Patient Status:  [facility] TBA  Chief Complaint: Left renal mass   Subjective: Patient to IR service from consultation with Dr. Laurence Ferrari on 08/11/2018 to discuss treatment options for a left renal mass.  He has a known history of mild dementia, BPH, hypertension, arthritis, nephrolithiasis, diverticulosis and prior left total knee replacement in 2017.  Recent imaging has also revealed a 1.5 cm inferior pole left renal mass which is suspicious for renal cell carcinoma.  Following discussions with Dr. Laurence Ferrari patient was deemed an appropriate candidate for microwave ablation and possible biopsy of the left renal mass.  He presents today for the procedure.  He currently denies fever, headache, chest pain, dyspnea, cough, abdominal/back pain, nausea, vomiting or bleeding.  Past Medical History:  Diagnosis Date   Arthritis    Dementia ([place])    mild   History of kidney stones    Hypertension    PONV (postoperative nausea and vomiting)    Past Surgical History:  Procedure Laterality Date   APPENDECTOMY     BOWEL RESECTION     CHOLECYSTECTOMY     COLONOSCOPY     cyst removed from neck     TOTAL KNEE ARTHROPLASTY Left 11/19/2015   Procedure: TOTAL KNEE ARTHROPLASTY;  Surgeon: Ninetta Lights, MD;  Location: [facility];  Service: Orthopedics;  Laterality: Left;       Allergies: Hydrocodone-acetaminophen  Medications: Prior to Admission medications   Medication Sig Start Date End Date Taking? Authorizing Provider  aspirin EC 81 MG tablet Take 81 mg by mouth daily.   Yes [provider]  donepezil (ARICEPT) 10 MG tablet Take 10 mg by mouth daily.   Yes [provider]  finasteride (PROSCAR) 5 MG tablet Take 5 mg by mouth daily.   Yes [provider]  lisinopril (ZESTRIL) 20 MG tablet Take 20 mg by mouth daily.  09/21/15  Yes [provider]  memantine (NAMENDA) 10  MG tablet Take 10 mg by mouth 2 (two) times daily.   Yes [provider]  Multiple Vitamin (MULTIVITAMIN WITH MINERALS) TABS tablet Take 1 tablet by mouth daily.   Yes [provider]  tamsulosin (FLOMAX) 0.4 MG CAPS capsule Take 0.4 mg by mouth 2 (two) times daily.   Yes [provider]  aspirin EC 325 MG tablet Take 1 tablet (325 mg total) by mouth daily. 1 tab a day for the next 30 days to prevent blood clots Patient not taking: Reported on 10/09/2018 11/19/15   Aundra Dubin, PA-C     Vital Signs: BP (!) 158/69    Pulse (!) 59    Temp 98.4 F (36.9 C) (Oral)    Resp 18    SpO2 97%   Physical Exam awake, alert.  Chest clear to auscultation bilaterally.  Heart with regular rate and rhythm.  Abdomen soft, positive bowel sounds, nontender.  No lower extremity edema.  Imaging: No results found.  Labs:  CBC: Recent Labs    10/11/18 1131 10/18/18 0720  WBC 8.9 8.8  HGB 14.9 14.0  HCT 45.0 42.4  PLT 264 249    COAGS: Recent Labs    10/18/18 0720  INR 1.0  APTT 30    BMP: Recent Labs    10/11/18 1131 10/18/18 0720  NA 138 139  K 4.7 4.0  CL 107 107  CO2 24 22  GLUCOSE 108* 112*  BUN 19 19  CALCIUM 9.3 8.8*  CREATININE 1.02 0.97  GFRNONAA >60 >60  GFRAA >60 >60    LIVER FUNCTION TESTS: Recent Labs    10/18/18 0720  BILITOT 0.9  AST 21  ALT 27  ALKPHOS 47  PROT 6.8  ALBUMIN 4.1    Assessment and Plan: Patient with history of mild dementia, BPH, arthritis, hypertension, diverticulosis, nephrolithiasis, prior left total knee replacement 2017 and recent imaging revealing a 1.5 cm inferior pole left renal mass suspicious for renal cell carcinoma.  Following discussions with Dr. Laurence Ferrari patient was deemed an appropriate candidate for microwave ablation and possible biopsy of the left renal mass and presents today for the procedure.  Details/risks of procedure, including but not limited to, internal bleeding, infection, injury to  adjacent structures discussed with patient with his understanding and consent.  He is COVID-19 negative.  Post procedure he will be admitted to the hospital for overnight observation.This procedure involves the use of X-rays and because of the nature of the planned procedure, it is possible that we will have prolonged use of X-ray fluoroscopy.  Potential radiation risks to you include (but are not limited to) the following: - A slightly elevated risk for cancer  several years later in life. This risk is typically less than 0.5% percent. This risk is low in comparison to the normal incidence of human cancer, which is 33% for women and 50% for men according to the [place]. - Radiation induced injury can include skin redness, resembling a rash, tissue breakdown / ulcers and hair loss (which can be temporary or permanent).   The likelihood of either of these occurring depends on the difficulty of the procedure and whether you are sensitive to radiation due to previous procedures, disease, or genetic conditions.   IF your procedure requires a prolonged use of radiation, you will be notified and given written instructions for further action.  It is your responsibility to monitor the irradiated area for the 2 weeks following the procedure and to notify your physician if you are concerned that you have suffered a radiation induced injury.      Electronically Signed: D. Rowe Robert, PA-C 10/18/2018, 8:06 AM   I spent a total of 30 minutes  at the the patient's bedside AND on the patient's hospital floor or unit, greater than 50% of which was counseling/coordinating care for CT-guided microwave ablation and possible biopsy of left renal mass

## 2018-10-18 NOTE — Progress Notes (Signed)
Pt heart rate started to jump between upper 20's to low 80's. Paged MD awaiting new orders.

## 2018-10-18 NOTE — Anesthesia Procedure Notes (Signed)
Procedure Name: Intubation Date/Time: 10/18/2018 8:55 AM Performed by: Anne Fu, CRNA Pre-anesthesia Checklist: Patient identified, Emergency Drugs available, Suction available, Patient being monitored and Timeout performed Patient Re-evaluated:Patient Re-evaluated prior to induction Oxygen Delivery Method: Circle system utilized Preoxygenation: Pre-oxygenation with 100% oxygen Induction Type: IV induction Ventilation: Mask ventilation without difficulty Laryngoscope Size: Mac and 4 Tube type: Oral Tube size: 7.5 mm Number of attempts: 1 Airway Equipment and Method: Stylet Placement Confirmation: ETT inserted through vocal cords under direct vision,  positive ETCO2 and breath sounds checked- equal and bilateral Secured at: 21 cm Tube secured with: Tape Dental Injury: Teeth and Oropharynx as per pre-operative assessment

## 2018-10-18 NOTE — Transfer of Care (Signed)
Immediate Anesthesia Transfer of Care Note  Patient: Javier Hall  Procedure(s) Performed: Procedure(s): MICROWAVE THERMAL ABLATION AND BIOPSY (N/A)  Patient Location: PACU  Anesthesia Type:General  Level of Consciousness:  sedated, patient cooperative and responds to stimulation  Airway & Oxygen Therapy:Patient Spontanous Breathing and Patient connected to face mask oxgen  Post-op Assessment:  Report given to PACU RN and Post -op Vital signs reviewed and stable  Post vital signs:  Reviewed and stable  Last Vitals:  Vitals:   10/18/18 0645  BP: (!) 158/69  Pulse: (!) 59  Resp: 18  Temp: 36.9 C  SpO2: 0000000    Complications: No apparent anesthesia complications

## 2018-10-18 NOTE — Procedures (Signed)
Interventional Radiology Procedure Note  Procedure: CT guided biopsy of left renal mass and thermal ablation of the same.   Complications: None  Estimated Blood Loss: None  Recommendations: - Path pending - Admit for obs   Signed,  Criselda Peaches, MD

## 2018-10-18 NOTE — Anesthesia Postprocedure Evaluation (Signed)
Anesthesia Post Note  Patient: Javier Hall  Procedure(s) Performed: MICROWAVE THERMAL ABLATION AND BIOPSY (N/A )     Patient location during evaluation: PACU Anesthesia Type: General Level of consciousness: sedated Pain management: pain level controlled Vital Signs Assessment: post-procedure vital signs reviewed and stable Respiratory status: spontaneous breathing and respiratory function stable Cardiovascular status: stable Postop Assessment: no apparent nausea or vomiting Anesthetic complications: no                  Esequiel Kleinfelter DANIEL

## 2018-10-18 NOTE — Progress Notes (Signed)
Pt received from [facility]. VSS. Pt resting comfortably with bed in lowest and locked position. Urine given. Pt reports he voided after removal of foley and prior to arrival on [facility]. Pt in no pain at this time. Will continue to monitor with purposeful rounding.

## 2018-10-18 NOTE — Progress Notes (Signed)
Rapid response RN called because pt pulse seemed irregular. Pt stated he felt fine and has no previous cardiac history. EKG performed showing normal sinus rhythm with PVC's. Vitals stable, MD notified will assess if anything changes.

## 2018-10-19 ENCOUNTER — Encounter (HOSPITAL_COMMUNITY): Payer: Self-pay | Admitting: Interventional Radiology

## 2018-10-19 DIAGNOSIS — N2889 Other specified disorders of kidney and ureter: Secondary | ICD-10-CM | POA: Diagnosis not present

## 2018-10-19 DIAGNOSIS — C642 Malignant neoplasm of left kidney, except renal pelvis: Secondary | ICD-10-CM | POA: Diagnosis not present

## 2018-10-19 LAB — BASIC METABOLIC PANEL
Anion gap: 9 (ref 5–15)
BUN: 15 mg/dL (ref 8–23)
CO2: 24 mmol/L (ref 22–32)
Calcium: 8.7 mg/dL — ABNORMAL LOW (ref 8.9–10.3)
Chloride: 105 mmol/L (ref 98–111)
Creatinine, Ser: 1.12 mg/dL (ref 0.61–1.24)
GFR calc Af Amer: >60 mL/min (ref >60)
GFR calc non Af Amer: >60 mL/min (ref >60)
Glucose, Bld: 131 mg/dL — ABNORMAL HIGH (ref 70–99)
Potassium: 4.5 mmol/L (ref 3.5–5.1)
Sodium: 138 mmol/L (ref 135–145)

## 2018-10-19 LAB — CBC WITH DIFFERENTIAL/PLATELET
Abs Immature Granulocytes: 0.12 10*3/uL — ABNORMAL HIGH (ref 0.00–0.07)
Basophils Absolute: 0 10*3/uL (ref 0.0–0.1)
Basophils Relative: 0 %
Eosinophils Absolute: 0 10*3/uL (ref 0.0–0.5)
Eosinophils Relative: 0 %
HCT: 40.1 % (ref 39.0–52.0)
Hemoglobin: 13.3 g/dL (ref 13.0–17.0)
Immature Granulocytes: 1 %
Lymphocytes Relative: 6 %
Lymphs Abs: 1.1 10*3/uL (ref 0.7–4.0)
MCH: 30 pg (ref 26.0–34.0)
MCHC: 33.2 g/dL (ref 30.0–36.0)
MCV: 90.3 fL (ref 80.0–100.0)
Monocytes Absolute: 1 10*3/uL (ref 0.1–1.0)
Monocytes Relative: 6 %
Neutro Abs: 15.9 10*3/uL — ABNORMAL HIGH (ref 1.7–7.7)
Neutrophils Relative %: 87 %
Platelets: 257 10*3/uL (ref 150–400)
RBC: 4.44 MIL/uL (ref 4.22–5.81)
RDW: 13.5 % (ref 11.5–15.5)
WBC: 18.1 10*3/uL — ABNORMAL HIGH (ref 4.0–10.5)
nRBC: 0 % (ref 0.0–0.2)

## 2018-10-19 LAB — ABO/RH: ABO/RH(D): A POS

## 2018-10-19 NOTE — Plan of Care (Signed)
  Problem: Education: Goal: Knowledge of General Education information will improve Description: Including pain rating scale, medication(s)/side effects and non-pharmacologic comfort measures Outcome: Completed/Met   Problem: Clinical Measurements: Goal: Ability to maintain clinical measurements within normal limits will improve Outcome: Completed/Met Goal: Will remain free from infection Outcome: Completed/Met Goal: Diagnostic test results will improve Outcome: Completed/Met Goal: Respiratory complications will improve Outcome: Completed/Met Goal: Cardiovascular complication will be avoided Outcome: Completed/Met   Problem: Activity: Goal: Risk for activity intolerance will decrease Outcome: Progressing   Problem: Nutrition: Goal: Adequate nutrition will be maintained Outcome: Completed/Met   Problem: Coping: Goal: Level of anxiety will decrease Outcome: Completed/Met   Problem: Elimination: Goal: Will not experience complications related to bowel motility Outcome: Completed/Met Goal: Will not experience complications related to urinary retention Outcome: Completed/Met   Problem: Pain Managment: Goal: General experience of comfort will improve Outcome: Completed/Met   Problem: Safety: Goal: Ability to remain free from injury will improve Outcome: Completed/Met   Problem: Skin Integrity: Goal: Risk for impaired skin integrity will decrease Outcome: Completed/Met

## 2018-10-19 NOTE — Discharge Instructions (Signed)
Percutaneous Kidney Biopsy, Care After °This sheet gives you information about how to care for yourself after your procedure. Your health care provider may also give you more specific instructions. If you have problems or questions, contact your health care provider. °What can I expect after the procedure? °After the procedure, it is common to have: °· Pain or soreness near the area where the needle went through your skin (biopsy site). °· Bright pink or cloudy urine for 24 hours after the procedure. °Follow these instructions at home: °Activity °· Return to your normal activities as told by your health care provider. Ask your health care provider what activities are safe for you. °· Do not drive for 24 hours if you were given a medicine to help you relax (sedative). °· Do not lift anything that is heavier than 10 lb (4.5 kg) until your health care provider tells you that it is safe. °· Avoid activities that take a lot of effort (are strenuous) until your health care provider approves. Most people will have to wait 2 weeks before returning to activities such as exercise or sexual intercourse. °General instructions ° °· Take over-the-counter and prescription medicines only as told by your health care provider. °· You may eat and drink after your procedure. Follow instructions from your health care provider about eating or drinking restrictions. °· Check your biopsy site every day for signs of infection. Check for: °? More redness, swelling, or pain. °? More fluid or blood. °? Warmth. °? Pus or a bad smell. °· Keep all follow-up visits as told by your health care provider. This is important. °Contact a health care provider if: °· You have more redness, swelling, or pain around your biopsy site. °· You have more fluid or blood coming from your biopsy site. °· Your biopsy site feels warm to the touch. °· You have pus or a bad smell coming from your biopsy site. °· You have blood in your urine more than 24 hours after  your procedure. °Get help right away if: °· You have dark red or brown urine. °· You have a fever. °· You are unable to urinate. °· You feel burning when you urinate. °· You feel faint. °· You have severe pain in your abdomen or side. °This information is not intended to replace advice given to you by your health care provider. Make sure you discuss any questions you have with your health care provider. °Document Released: 08/30/2012 Document Revised: 12/10/2016 Document Reviewed: 10/10/2015 °Elsevier Patient Education © 2020 [employer] ° °

## 2018-10-19 NOTE — Progress Notes (Signed)
Pt discharged home today per K. Allred, PA. Pt's IV site D/C'd and WDL. Pt's VSS. Pt provided with home medication list and discharge instructions. Verbalized understanding. Pt left floor via WC in stable condition accompanied by NT.

## 2018-10-19 NOTE — Plan of Care (Signed)
  Problem: Education: Goal: Knowledge of General Education information will improve Description: Including pain rating scale, medication(s)/side effects and non-pharmacologic comfort measures Outcome: Completed/Met   Problem: Health Behavior/Discharge Planning: Goal: Ability to manage health-related needs will improve Outcome: Completed/Met   Problem: Clinical Measurements: Goal: Ability to maintain clinical measurements within normal limits will improve Outcome: Completed/Met Goal: Will remain free from infection Outcome: Completed/Met Goal: Diagnostic test results will improve Outcome: Completed/Met Goal: Respiratory complications will improve Outcome: Completed/Met Goal: Cardiovascular complication will be avoided Outcome: Completed/Met   Problem: Activity: Goal: Risk for activity intolerance will decrease 10/19/2018 1502 by Annie Sable, RN Outcome: Completed/Met 10/19/2018 0940 by Annie Sable, RN Outcome: Progressing   Problem: Nutrition: Goal: Adequate nutrition will be maintained Outcome: Completed/Met   Problem: Coping: Goal: Level of anxiety will decrease Outcome: Completed/Met   Problem: Elimination: Goal: Will not experience complications related to bowel motility Outcome: Completed/Met Goal: Will not experience complications related to urinary retention Outcome: Completed/Met   Problem: Pain Managment: Goal: General experience of comfort will improve Outcome: Completed/Met   Problem: Safety: Goal: Ability to remain free from injury will improve Outcome: Completed/Met   Problem: Skin Integrity: Goal: Risk for impaired skin integrity will decrease Outcome: Completed/Met

## 2018-10-19 NOTE — Progress Notes (Signed)
Pt ambulated in hallway with stand by assist from NT. Ambulated approximately 100 feet with no complaints.

## 2018-10-19 NOTE — Discharge Summary (Addendum)
Patient ID: Javier Hall MRN: [id number] DOB/AGE: 1935/06/16 83 y.o.  Admit date: 10/18/2018 Discharge date: 10/19/2018  Supervising Physician: Jacqulynn Cadet  Patient Status: [facility] - In-pt  Admission Diagnoses: Left renal mass  Discharge Diagnoses: Left renal mass, status post technically successful CT-guided core biopsy and percutaneous thermal ablation on 10/18/2018  Active Problems:   Renal malignant tumor, left [facility])  Past Medical History:  Diagnosis Date   Arthritis    Dementia ([place])    mild   History of kidney stones    Hypertension    PONV (postoperative nausea and vomiting)    Past Surgical History:  Procedure Laterality Date   APPENDECTOMY     BOWEL RESECTION     CHOLECYSTECTOMY     COLONOSCOPY     cyst removed from neck     RADIOFREQUENCY ABLATION N/A 10/18/2018   Procedure: MICROWAVE THERMAL ABLATION AND BIOPSY;  Surgeon: Jacqulynn Cadet, MD;  Location: [facility];  Service: Anesthesiology;  Laterality: N/A;   TOTAL KNEE ARTHROPLASTY Left 11/19/2015   Procedure: TOTAL KNEE ARTHROPLASTY;  Surgeon: Ninetta Lights, MD;  Location: [place];  Service: Orthopedics;  Laterality: Left;     Discharged Condition: good  Hospital Course: Javier Hall is an 83 year old male with history of mild dementia, BPH, arthritis, hypertension, diverticulosis, nephrolithiasis, prior left total knee replacement 2017 and recent imaging revealing a 1.5 cm inferior pole left renal mass suspicious for renal cell carcinoma.  Following recent consultation with Dr. Laurence Ferrari he was deemed an appropriate candidate for microwave ablation/biopsy of the left renal mass and underwent the procedure at [facility] on 10/18/2018 via general anesthesia.  The procedure was performed without immediate complications and he was observed in the hospital overnight.  Overnight he did well without significant complaints other than some minor throat irritation from prior ET tube placement.   He did have some ectopy noted on EKG (PVC'S)and was placed on telemetry.  He denied any chest pain, shortness of breath, nausea, vomiting, flank pain, or hematuria.  On the morning of discharge he was stable.  He was afebrile, and continued to deny headache, fever, chest pain, dyspnea, abdominal/back pain, nausea, vomiting or bleeding.  He did have a slight cough from throat irritation secondary to prior ET tube placement.  Follow-up WBC was 18.1 (not unexpected post ablation), hemoglobin and platelets were stable and creatinine was normal at 1.12.  Above findings were discussed with Dr. Laurence Ferrari and the patient was deemed stable for discharge at this time.  He will continue on his current home medications.  He was advised to follow-up with his primary care MD/cardiologist regarding prior cardiac ectopy.  He was encouraged to avoid strenuous activity and stay well-hydrated.  He will be scheduled for follow-up either via phone call or in person in IR clinic by Dr. Laurence Ferrari in 3 to 4 weeks.  He was told to contact our service with any additional questions or concerns.  Consults: none  Significant Diagnostic Studies:  Results for orders placed or performed during the hospital encounter of 10/18/18  APTT  Result Value Ref Range   aPTT 30 24 - 36 seconds  CBC  Result Value Ref Range   WBC 8.8 4.0 - 10.5 K/uL   RBC 4.68 4.22 - 5.81 MIL/uL   Hemoglobin 14.0 13.0 - 17.0 g/dL   HCT 42.4 39.0 - 52.0 %   MCV 90.6 80.0 - 100.0 fL   MCH 29.9 26.0 - 34.0 pg   MCHC 33.0 30.0 -  36.0 g/dL   RDW 13.7 11.5 - 15.5 %   Platelets 249 150 - 400 K/uL   nRBC 0.0 0.0 - 0.2 %  Comprehensive metabolic panel  Result Value Ref Range   Sodium 139 135 - 145 mmol/L   Potassium 4.0 3.5 - 5.1 mmol/L   Chloride 107 98 - 111 mmol/L   CO2 22 22 - 32 mmol/L   Glucose, Bld 112 (H) 70 - 99 mg/dL   BUN 19 8 - 23 mg/dL   Creatinine, Ser 0.97 0.61 - 1.24 mg/dL   Calcium 8.8 (L) 8.9 - 10.3 mg/dL   Total Protein 6.8 6.5 - 8.1  g/dL   Albumin 4.1 3.5 - 5.0 g/dL   AST 21 15 - 41 U/L   ALT 27 0 - 44 U/L   Alkaline Phosphatase 47 38 - 126 U/L   Total Bilirubin 0.9 0.3 - 1.2 mg/dL   GFR calc non Af Amer >60 >60 mL/min   GFR calc Af Amer >60 >60 mL/min   Anion gap 10 5 - 15  Protime-INR  Result Value Ref Range   Prothrombin Time 12.9 11.4 - 15.2 seconds   INR 1.0 0.8 - 1.2  CBC with Differential/Platelet  Result Value Ref Range   WBC 18.1 (H) 4.0 - 10.5 K/uL   RBC 4.44 4.22 - 5.81 MIL/uL   Hemoglobin 13.3 13.0 - 17.0 g/dL   HCT 40.1 39.0 - 52.0 %   MCV 90.3 80.0 - 100.0 fL   MCH 30.0 26.0 - 34.0 pg   MCHC 33.2 30.0 - 36.0 g/dL   RDW 13.5 11.5 - 15.5 %   Platelets 257 150 - 400 K/uL   nRBC 0.0 0.0 - 0.2 %   Neutrophils Relative % 87 %   Neutro Abs 15.9 (H) 1.7 - 7.7 K/uL   Lymphocytes Relative 6 %   Lymphs Abs 1.1 0.7 - 4.0 K/uL   Monocytes Relative 6 %   Monocytes Absolute 1.0 0.1 - 1.0 K/uL   Eosinophils Relative 0 %   Eosinophils Absolute 0.0 0.0 - 0.5 K/uL   Basophils Relative 0 %   Basophils Absolute 0.0 0.0 - 0.1 K/uL   Immature Granulocytes 1 %   Abs Immature Granulocytes 0.12 (H) 0.00 - 0.07 K/uL  Basic metabolic panel  Result Value Ref Range   Sodium 138 135 - 145 mmol/L   Potassium 4.5 3.5 - 5.1 mmol/L   Chloride 105 98 - 111 mmol/L   CO2 24 22 - 32 mmol/L   Glucose, Bld 131 (H) 70 - 99 mg/dL   BUN 15 8 - 23 mg/dL   Creatinine, Ser 1.12 0.61 - 1.24 mg/dL   Calcium 8.7 (L) 8.9 - 10.3 mg/dL   GFR calc non Af Amer >60 >60 mL/min   GFR calc Af Amer >60 >60 mL/min   Anion gap 9 5 - 15  Type and screen  Result Value Ref Range   ABO/RH(D) A POS    Antibody Screen NEG    Sample Expiration      10/21/2018,2359 Performed at [facility], [place] [address] [place], [place] [address]   ABO/Rh  Result Value Ref Range   ABO/RH(D)      A POS Performed at [facility], [place] [address] [place], [place] [address]      Treatments: Technically successful  CT-guided core biopsy and percutaneous thermal ablation of left renal mass on 10/18/2018 via general anesthesia  Discharge Exam: Blood pressure 128/62, pulse  67, temperature 98.1 F (36.7 C), temperature source Oral, resp. rate 20, height 5\' 10"  (1.778 m), weight 187 lb 2.7 oz (84.9 kg), SpO2 93 %. Awake, alert.  Chest clear to auscultation bilaterally.  Heart with slightly bradycardic rate/occ ectopy (PVC on telemetry).  Abdomen soft, positive bowel sounds, nontender.  Puncture site left flank clean, dry, soft, nontender, no obvious hematoma.  No lower extremity edema  Disposition: Discharge disposition: 01-Home or Self Care       Discharge Instructions    Call MD for:  difficulty breathing, headache or visual disturbances   Complete by: As directed    Call MD for:  extreme fatigue   Complete by: As directed    Call MD for:  hives   Complete by: As directed    Call MD for:  persistant dizziness or light-headedness   Complete by: As directed    Call MD for:  persistant nausea and vomiting   Complete by: As directed    Call MD for:  redness, tenderness, or signs of infection (pain, swelling, redness, odor or green/yellow discharge around incision site)   Complete by: As directed    Call MD for:  severe uncontrolled pain   Complete by: As directed    Call MD for:  temperature >100.4   Complete by: As directed    Change dressing (specify)   Complete by: As directed    May apply bandage to puncture site left flank region for the next 2 to 3 days and change daily.  May wash site with soap and water.   Diet - low sodium heart healthy   Complete by: As directed    Driving Restrictions   Complete by: As directed    No driving for 24 hours   Increase activity slowly   Complete by: As directed    Lifting restrictions   Complete by: As directed    No heavy lifting for the next 3 to 4 days   May shower / Bathe   Complete by: As directed    May walk up steps   Complete by: As  directed      Allergies as of 10/19/2018      Reactions   Hydrocodone-acetaminophen    Causes hiccups       Medication List    TAKE these medications   aspirin EC 81 MG tablet Take 81 mg by mouth daily. What changed: Another medication with the same name was removed. Continue taking this medication, and follow the directions you see here.   donepezil 10 MG tablet Commonly known as: ARICEPT Take 10 mg by mouth daily.   finasteride 5 MG tablet Commonly known as: PROSCAR Take 5 mg by mouth daily.   lisinopril 20 MG tablet Commonly known as: ZESTRIL Take 20 mg by mouth daily.   memantine 10 MG tablet Commonly known as: NAMENDA Take 10 mg by mouth 2 (two) times daily.   multivitamin with minerals Tabs tablet Take 1 tablet by mouth daily.   tamsulosin 0.4 MG Caps capsule Commonly known as: FLOMAX Take 0.4 mg by mouth 2 (two) times daily.            Discharge Care Instructions  (From admission, onward)         Start     Ordered   10/19/18 0000  Change dressing (specify)    Comments: May apply bandage to puncture site left flank region for the next 2 to 3 days and change daily.  May wash  site with soap and water.   10/19/18 1425         Follow-up Information    Jacqulynn Cadet, MD Follow up.   Specialties: Interventional Radiology, Radiology Why: Radiology will call you with either follow-up appointment in IR clinic or virtual visit with Dr. Laurence Ferrari in 3 to 4 weeks.  May call [phone] or [phone] with any questions Contact information: [address] [place] [address] [phone]        Joie Bimler, MD Follow up.   Specialty: Urology Why: Follow-up with Dr. Nila Nephew as scheduled Contact information: [address] [place] [address] [phone]            Electronically Signed: D. Rowe Robert, PA-C 10/19/2018, 2:31 PM   I have spent Less Than 30 Minutes discharging Jed L[place]ick.

## 2018-10-20 LAB — SURGICAL PATHOLOGY

## 2018-10-25 DIAGNOSIS — C642 Malignant neoplasm of left kidney, except renal pelvis: Secondary | ICD-10-CM | POA: Diagnosis not present

## 2018-10-25 DIAGNOSIS — Z23 Encounter for immunization: Secondary | ICD-10-CM | POA: Diagnosis not present

## 2018-10-30 ENCOUNTER — Encounter: Payer: Self-pay | Admitting: *Deleted

## 2018-10-30 ENCOUNTER — Encounter: Payer: Self-pay | Admitting: Cardiology

## 2018-10-31 ENCOUNTER — Other Ambulatory Visit: Payer: Self-pay

## 2018-10-31 ENCOUNTER — Encounter: Payer: Self-pay | Admitting: Cardiology

## 2018-10-31 ENCOUNTER — Ambulatory Visit (INDEPENDENT_AMBULATORY_CARE_PROVIDER_SITE_OTHER): Payer: PPO

## 2018-10-31 ENCOUNTER — Ambulatory Visit (INDEPENDENT_AMBULATORY_CARE_PROVIDER_SITE_OTHER): Payer: PPO | Admitting: Cardiology

## 2018-10-31 VITALS — BP 122/64 | HR 57 | Ht 71.0 in | Wt 189.0 lb

## 2018-10-31 DIAGNOSIS — I493 Ventricular premature depolarization: Secondary | ICD-10-CM

## 2018-10-31 DIAGNOSIS — I10 Essential (primary) hypertension: Secondary | ICD-10-CM

## 2018-10-31 DIAGNOSIS — E782 Mixed hyperlipidemia: Secondary | ICD-10-CM | POA: Diagnosis not present

## 2018-10-31 DIAGNOSIS — R011 Cardiac murmur, unspecified: Secondary | ICD-10-CM

## 2018-10-31 NOTE — Patient Instructions (Signed)
Medication Instructions:  Your physician recommends that you continue on your current medications as directed. Please refer to the Current Medication list given to you today.  *If you need a refill on your cardiac medications before your next appointment, please call your pharmacy*  Lab Work: None If you have labs (blood work) drawn today and your tests are completely normal, you will receive your results only by: Marland Kitchen MyChart Message (if you have MyChart) OR . A paper copy in the mail If you have any lab test that is abnormal or we need to change your treatment, we will call you to review the results.  Testing/Procedures: Your physician has requested that you have an echocardiogram. Echocardiography is a painless test that uses sound waves to create images of your heart. It provides your doctor with information about the size and shape of your heart and how well your heart's chambers and valves are working. This procedure takes approximately one hour. There are no restrictions for this procedure.  A zio monitor was placed today. It will remain on for 7 days. You will then return monitor and event diary in provided box. It takes 1-2 weeks for report to be downloaded and returned to [place]. We will call you with the results. If monitor falls off or has orange flashing light, please call Zio for further instructions.     Follow-Up: At [facility], you and your health needs are our priority.  As part of our continuing mission to provide you with exceptional heart care, we have created designated Provider Care Teams.  These Care Teams include your primary Cardiologist (physician) and Advanced Practice Providers (APPs -  Physician Assistants and Nurse Practitioners) who all work together to provide you with the care you need, when you need it.  Your next appointment:   3 months  The format for your next appointment:   In Person  Provider:   Berniece Salines, DO  Other Instructions    Echocardiogram An echocardiogram is a procedure that uses painless sound waves (ultrasound) to produce an image of the heart. Images from an echocardiogram can provide important information about:  Signs of coronary artery disease (CAD).  Aneurysm detection. An aneurysm is a weak or damaged part of an artery wall that bulges out from the normal force of blood pumping through the body.  Heart size and shape. Changes in the size or shape of the heart can be associated with certain conditions, including heart failure, aneurysm, and CAD.  Heart muscle function.  Heart valve function.  Signs of a past heart attack.  Fluid buildup around the heart.  Thickening of the heart muscle.  A tumor or infectious growth around the heart valves. Tell a health care provider about:  Any allergies you have.  All medicines you are taking, including vitamins, herbs, eye drops, creams, and over-the-counter medicines.  Any blood disorders you have.  Any surgeries you have had.  Any medical conditions you have.  Whether you are pregnant or may be pregnant. What are the risks? Generally, this is a safe procedure. However, problems may occur, including:  Allergic reaction to dye (contrast) that may be used during the procedure. What happens before the procedure? No specific preparation is needed. You may eat and drink normally. What happens during the procedure?   An IV tube may be inserted into one of your veins.  You may receive contrast through this tube. A contrast is an injection that improves the quality of the pictures from your  heart.  A gel will be applied to your chest.  A wand-like tool (transducer) will be moved over your chest. The gel will help to transmit the sound waves from the transducer.  The sound waves will harmlessly bounce off of your heart to allow the heart images to be captured in real-time motion. The images will be recorded on a computer. The procedure may vary  among health care providers and hospitals. What happens after the procedure?  You may return to your normal, everyday life, including diet, activities, and medicines, unless your health care provider tells you not to do that. Summary  An echocardiogram is a procedure that uses painless sound waves (ultrasound) to produce an image of the heart.  Images from an echocardiogram can provide important information about the size and shape of your heart, heart muscle function, heart valve function, and fluid buildup around your heart.  You do not need to do anything to prepare before this procedure. You may eat and drink normally.  After the echocardiogram is completed, you may return to your normal, everyday life, unless your health care provider tells you not to do that. This information is not intended to replace advice given to you by your health care provider. Make sure you discuss any questions you have with your health care provider. Document Released: 12/26/1999 Document Revised: 04/20/2018 Document Reviewed: 01/31/2016 Elsevier Patient Education  2020 [employer].

## 2018-10-31 NOTE — Progress Notes (Signed)
Cardiology Office Note:    Date:  10/31/2018   ID:  Javier Hall, DOB 1935-02-24, MRN [id number]  PCP:  Javier Hall., MD  Cardiologist:  No primary care provider on file.  Electrophysiologist:  None   Referring MD: Javier Hall., MD  The patient was referred PVCs.  History of Present Illness:    Javier Hall is a 83 y.o. male with a hx of hypertension, lipidemia presents to be evaluated for palpitations.  Patient tells me that he went for a procedure, and during his procedure he was told that his heart was skipping beats.  He did feel dizzy that day he noted post procedure he was advised to see a cardiologist.  He admits to intermittent palpitations but denies any shortness of breath, nausea, chest pain, lightheadedness.  Of note he says more than 5 years ago he had monitor placed on him because he was told that he may have been skipping a beat.  At that time the monitor was unremarkable.   Past Medical History:  Diagnosis Date  . Alzheimer's dementia (Javier Hall)   . Arrhythmia   . Arthritis   . Dementia (Javier Hall)    mild  . Essential hypertension 03/11/2015  . Familial hemochromatosis ([place])   . History of kidney stones   . Hyperlipidemia   . Hypertension   . Mild cognitive impairment 10/03/2012  . Near syncope 03/11/2015  . PONV (postoperative nausea and vomiting)   . Renal malignant tumor, left (Javier Hall) 10/18/2018  . S/P total knee replacement 11/19/2015    Past Surgical History:  Procedure Laterality Date  . APPENDECTOMY    . BOWEL RESECTION    . CHOLECYSTECTOMY    . COLONOSCOPY    . cyst removed from neck    . RADIOFREQUENCY ABLATION N/A 10/18/2018   Procedure: MICROWAVE THERMAL ABLATION AND BIOPSY;  Surgeon: Jacqulynn Cadet, MD;  Location: [facility];  Service: Anesthesiology;  Laterality: N/A;  . SKIN LESION EXCISION     multiple  . TOTAL KNEE ARTHROPLASTY Left 11/19/2015   Procedure: TOTAL KNEE ARTHROPLASTY;  Surgeon: Ninetta Lights, MD;  Location: [place];  Service: Orthopedics;  Laterality: Left;    Current Medications: Current Meds  Medication Sig  . aspirin EC 81 MG tablet Take 81 mg by mouth daily.  Marland Kitchen dextromethorphan-guaiFENesin (MUCINEX DM) 30-600 MG 12hr tablet Take 1 tablet by mouth 2 (two) times daily.  . diphenhydrAMINE (BENADRYL) 25 mg capsule Take 25 mg by mouth every 6 (six) hours as needed.  . donepezil (ARICEPT) 10 MG tablet Take 10 mg by mouth daily.  . finasteride (PROSCAR) 5 MG tablet Take 5 mg by mouth daily.  Marland Kitchen lisinopril (ZESTRIL) 20 MG tablet Take 20 mg by mouth daily.   . meclizine (ANTIVERT) 25 MG tablet Take 25 mg by mouth 3 (three) times daily as needed for dizziness.  . memantine (NAMENDA) 10 MG tablet Take 10 mg by mouth 2 (two) times daily.  . Multiple Vitamin (MULTIVITAMIN WITH MINERALS) TABS tablet Take 1 tablet by mouth daily.  . tamsulosin (FLOMAX) 0.4 MG CAPS capsule Take 0.4 mg by mouth 2 (two) times daily.     Allergies:   Hydrocodone-acetaminophen   Social History   Socioeconomic History  . Marital status: Married    Spouse name: Not on file  . Number of children: Not on file  . Years of education: Not on file  . Highest education level: Not on file  Occupational History  . Not on  file  Social Needs  . Financial resource strain: Not on file  . Food insecurity    Worry: Not on file    Inability: Not on file  . Transportation needs    Medical: Not on file    Non-medical: Not on file  Tobacco Use  . Smoking status: Never Smoker  . Smokeless tobacco: Never Used  Substance and Sexual Activity  . Alcohol use: No  . Drug use: No  . Sexual activity: Not on file  Lifestyle  . Physical activity    Days per week: Not on file    Minutes per session: Not on file  . Stress: Not on file  Relationships  . Social Herbalist on phone: Not on file    Gets together: Not on file    Attends religious service: Not on file    Active member of club or organization: Not on file    Attends  meetings of clubs or organizations: Not on file    Relationship status: Not on file  Other Topics Concern  . Not on file  Social History Narrative  . Not on file     Family History: The patient's family history includes Alcohol abuse in his brother; Aneurysm in his father; Breast cancer in his mother and sister; Diabetes in his father; Hypertension in his father.  ROS:   Review of Systems  Constitution: Negative for decreased appetite, fever and weight gain.  HENT: Negative for congestion, ear discharge, hoarse voice and sore throat.   Eyes: Negative for discharge, redness, vision loss in right eye and visual halos.  Cardiovascular: Reports palpitations.  Negative for chest pain, dyspnea on exertion, leg swelling, orthopnea. Respiratory: Negative for cough, hemoptysis, shortness of breath and snoring.   Endocrine: Negative for heat intolerance and polyphagia.  Hematologic/Lymphatic: Negative for bleeding problem. Does not bruise/bleed easily.  Skin: Negative for flushing, nail changes, rash and suspicious lesions.  Musculoskeletal: Negative for arthritis, joint pain, muscle cramps, myalgias, neck pain and stiffness.  Gastrointestinal: Negative for abdominal pain, bowel incontinence, diarrhea and excessive appetite.  Genitourinary: Negative for decreased libido, genital sores and incomplete emptying.  Neurological: Negative for brief paralysis, focal weakness, headaches and loss of balance.  Psychiatric/Behavioral: Negative for altered mental status, depression and suicidal ideas.  Allergic/Immunologic: Negative for HIV exposure and persistent infections.    EKGs/Labs/Other Studies Reviewed:    The following studies were reviewed today:   EKG:  The ekg ordered today demonstrates sinus bradycardia, heart rate 50 bpm compared to EKG performed on October 18, 2018 which demonstrates sinus rhythm, 75 bpm with recurrent PVCs. Recent Labs: 10/18/2018: ALT 27 10/19/2018: BUN 15; Creatinine,  Ser 1.12; Hemoglobin 13.3; Platelets 257; Potassium 4.5; Sodium 138  Recent Lipid Panel No results found for: CHOL, TRIG, HDL, CHOLHDL, VLDL, LDLCALC, LDLDIRECT  Physical Exam:    VS:  BP 122/64   Pulse (!) 57   Ht 5\' 11"  (1.803 m)   Wt 189 lb (85.7 kg)   SpO2 95%   BMI 26.36 kg/m     Wt Readings from Last 3 Encounters:  10/31/18 189 lb (85.7 kg)  10/25/18 188 lb (85.3 kg)  10/19/18 187 lb 2.7 oz (84.9 kg)     GEN: Well nourished, well developed in no acute distress HEENT: Normal NECK: No JVD; No carotid bruits LYMPHATICS: No lymphadenopathy CARDIAC: S1S2 noted,RRR, 2/6 systolic murmurs, rubs, gallops RESPIRATORY:  Clear to auscultation without rales, wheezing or rhonchi  ABDOMEN: Soft, non-tender, non-distended, +bowel  sounds, no guarding. EXTREMITIES: No edema, No cyanosis, no clubbing MUSCULOSKELETAL:  No edema; No deformity  SKIN: Warm and dry NEUROLOGIC:  Alert and oriented x 3, non-focal PSYCHIATRIC:  Normal affect, good insight  ASSESSMENT:    1. PVC (premature ventricular contraction)   2. Essential hypertension   3. Heart murmur   4. Mixed hyperlipidemia    PLAN:     1.  At this time is appropriate to place a Zio patch and patient to assess his PVC burden.   In addition a transthoracic echocardiogram will be ordered in the setting of his heart murmur appreciated on physical exam.  The echo will also help me assess his LV function and any other structural abnormalities.  No changes will be made to his current medication regimen.  He will follow-up in 3 months.  The patient is in agreement with the above plan. The patient left the office in stable condition.  The patient will follow up in 3 months   Medication Adjustments/Labs and Tests Ordered: Current medicines are reviewed at length with the patient today.  Concerns regarding medicines are outlined above.  Orders Placed This Encounter  Procedures  . LONG TERM MONITOR (3-14 DAYS)  . EKG 12-Lead  .  ECHOCARDIOGRAM COMPLETE   No orders of the defined types were placed in this encounter.   Patient Instructions  Medication Instructions:  Your physician recommends that you continue on your current medications as directed. Please refer to the Current Medication list given to you today.  *If you need a refill on your cardiac medications before your next appointment, please call your pharmacy*  Lab Work: None If you have labs (blood work) drawn today and your tests are completely normal, you will receive your results only by: Marland Kitchen MyChart Message (if you have MyChart) OR . A paper copy in the mail If you have any lab test that is abnormal or we need to change your treatment, we will call you to review the results.  Testing/Procedures: Your physician has requested that you have an echocardiogram. Echocardiography is a painless test that uses sound waves to create images of your heart. It provides your doctor with information about the size and shape of your heart and how well your heart's chambers and valves are working. This procedure takes approximately one hour. There are no restrictions for this procedure.  A zio monitor was placed today. It will remain on for 7 days. You will then return monitor and event diary in provided box. It takes 1-2 weeks for report to be downloaded and returned to [place]. We will call you with the results. If monitor falls off or has orange flashing light, please call Zio for further instructions.     Follow-Up: At [facility], you and your health needs are our priority.  As part of our continuing mission to provide you with exceptional heart care, we have created designated Provider Care Teams.  These Care Teams include your primary Cardiologist (physician) and Advanced Practice Providers (APPs -  Physician Assistants and Nurse Practitioners) who all work together to provide you with the care you need, when you need it.  Your next appointment:   3 months  The  format for your next appointment:   In Person  Provider:   Berniece Salines, DO  Other Instructions   Echocardiogram An echocardiogram is a procedure that uses painless sound waves (ultrasound) to produce an image of the heart. Images from an echocardiogram can provide important information about:  Signs of coronary artery disease (CAD).  Aneurysm detection. An aneurysm is a weak or damaged part of an artery wall that bulges out from the normal force of blood pumping through the body.  Heart size and shape. Changes in the size or shape of the heart can be associated with certain conditions, including heart failure, aneurysm, and CAD.  Heart muscle function.  Heart valve function.  Signs of a past heart attack.  Fluid buildup around the heart.  Thickening of the heart muscle.  A tumor or infectious growth around the heart valves. Tell a health care provider about:  Any allergies you have.  All medicines you are taking, including vitamins, herbs, eye drops, creams, and over-the-counter medicines.  Any blood disorders you have.  Any surgeries you have had.  Any medical conditions you have.  Whether you are pregnant or may be pregnant. What are the risks? Generally, this is a safe procedure. However, problems may occur, including:  Allergic reaction to dye (contrast) that may be used during the procedure. What happens before the procedure? No specific preparation is needed. You may eat and drink normally. What happens during the procedure?   An IV tube may be inserted into one of your veins.  You may receive contrast through this tube. A contrast is an injection that improves the quality of the pictures from your heart.  A gel will be applied to your chest.  A wand-like tool (transducer) will be moved over your chest. The gel will help to transmit the sound waves from the transducer.  The sound waves will harmlessly bounce off of your heart to allow the heart images  to be captured in real-time motion. The images will be recorded on a computer. The procedure may vary among health care providers and hospitals. What happens after the procedure?  You may return to your normal, everyday life, including diet, activities, and medicines, unless your health care provider tells you not to do that. Summary  An echocardiogram is a procedure that uses painless sound waves (ultrasound) to produce an image of the heart.  Images from an echocardiogram can provide important information about the size and shape of your heart, heart muscle function, heart valve function, and fluid buildup around your heart.  You do not need to do anything to prepare before this procedure. You may eat and drink normally.  After the echocardiogram is completed, you may return to your normal, everyday life, unless your health care provider tells you not to do that. This information is not intended to replace advice given to you by your health care provider. Make sure you discuss any questions you have with your health care provider. Document Released: 12/26/1999 Document Revised: 04/20/2018 Document Reviewed: 01/31/2016 Elsevier Patient Education  2020 [employer].      Adopting a Healthy Lifestyle.  Know what a healthy weight is for you (roughly BMI <25) and aim to maintain this   Aim for 7+ servings of fruits and vegetables daily   65-80+ fluid ounces of water or unsweet tea for healthy kidneys   Limit to max 1 drink of alcohol per day; avoid smoking/tobacco   Limit animal fats in diet for cholesterol and heart health - choose grass fed whenever available   Avoid highly processed foods, and foods high in saturated/trans fats   Aim for low stress - take time to unwind and care for your mental health   Aim for 150 min of moderate intensity exercise weekly for heart health,  and weights twice weekly for bone health   Aim for 7-9 hours of sleep daily   When it comes to diets,  agreement about the perfect plan isnt easy to find, even among the experts. Experts at the [employer] developed an idea known as the Healthy Eating Plate. Just imagine a plate divided into logical, healthy portions.   The emphasis is on diet quality:   Load up on vegetables and fruits - one-half of your plate: Aim for color and variety, and remember that potatoes dont count.   Go for whole grains - one-quarter of your plate: Whole wheat, barley, wheat berries, quinoa, oats, brown rice, and foods made with them. If you want pasta, go with whole wheat pasta.   Protein power - one-quarter of your plate: Fish, chicken, beans, and nuts are all healthy, versatile protein sources. Limit red meat.   The diet, however, does go beyond the plate, offering a few other suggestions.   Use healthy plant oils, such as olive, canola, soy, corn, sunflower and peanut. Check the labels, and avoid partially hydrogenated oil, which have unhealthy trans fats.   If youre thirsty, drink water. Coffee and tea are good in moderation, but skip sugary drinks and limit milk and dairy products to one or two daily servings.   The type of carbohydrate in the diet is more important than the amount. Some sources of carbohydrates, such as vegetables, fruits, whole grains, and beans-are healthier than others.   Finally, stay active  Signed, Berniece Salines, DO  10/31/2018 11:32 AM    [place]

## 2018-11-01 DIAGNOSIS — N401 Enlarged prostate with lower urinary tract symptoms: Secondary | ICD-10-CM | POA: Diagnosis not present

## 2018-11-01 DIAGNOSIS — N2889 Other specified disorders of kidney and ureter: Secondary | ICD-10-CM | POA: Diagnosis not present

## 2018-11-03 DIAGNOSIS — C642 Malignant neoplasm of left kidney, except renal pelvis: Secondary | ICD-10-CM | POA: Diagnosis not present

## 2018-11-07 DIAGNOSIS — I493 Ventricular premature depolarization: Secondary | ICD-10-CM | POA: Diagnosis not present

## 2018-11-14 ENCOUNTER — Ambulatory Visit
Admission: RE | Admit: 2018-11-14 | Discharge: 2018-11-14 | Disposition: A | Discharge status: Home / Self Care | Payer: PPO | Source: Ambulatory Visit | Attending: Radiology | Admitting: Radiology

## 2018-11-14 ENCOUNTER — Encounter: Payer: Self-pay | Admitting: *Deleted

## 2018-11-14 ENCOUNTER — Other Ambulatory Visit: Payer: Self-pay

## 2018-11-14 DIAGNOSIS — C642 Malignant neoplasm of left kidney, except renal pelvis: Secondary | ICD-10-CM

## 2018-11-14 HISTORY — PX: IR RADIOLOGIST EVAL & MGMT: IMG5224

## 2018-11-14 NOTE — Progress Notes (Signed)
Chief Complaint: Patient was consulted remotely today (TeleHealth) for left renal cell carcinoma at the request of Allred,Darrell K.    Referring Physician(s): Dr. Joie Bimler  History of Present Illness: Javier Hall is a 83 y.o. male  who presented at the kind request of Dr. Nila Nephew to discuss minimally invasion of the options for treatment of a solid left renal mass. Javier Hall was recently admitted with a urinary tract infection. Imaging demonstrated a small but enhancing solid mass arising from the interpolar left kidney. A follow-up MRI of the abdomen was performed confirming findings highly suspicious for a renal cell carcinoma. The mass measures 1.5 x 1.4 cm.   Javier Hall has a strong family history of various malignancies and is very motivated to undergo treatment. At his advanced age, he is looking for a less invasive option than partial nephrectomy. Currently, he is doing well. He does have a history of dementia as well as dysuria from benign prostatic hyperplasia.   He underwent concurrent CT-guided biopsy and percutaneous thermal ablation of the left renal lesion on 10/18/2018.  He presents today for 1 month follow-up evaluation.  His postoperative course was essentially uncomplicated.  He had 1 transient episode of palpitations which was self-limited and has not recurred.  He has had no back pain, flank pain, dysuria or hematuria.  Overall, he is extremely impressed with the ease of his recovery and lack of associated symptoms.  He denies any significant systemic symptoms at this time including chest pain, fever, chills, flank pain, shortness of breath or other symptoms.     Past Medical History:  Diagnosis Date   Alzheimer's dementia ([place])    Arrhythmia    Arthritis    Dementia ([place])    mild   Essential hypertension 03/11/2015   Familial hemochromatosis ([place])    History of kidney stones    Hyperlipidemia    Hypertension    Mild cognitive  impairment 10/03/2012   Near syncope 03/11/2015   PONV (postoperative nausea and vomiting)    Renal malignant tumor, left ([place]) 10/18/2018   S/P total knee replacement 11/19/2015    Past Surgical History:  Procedure Laterality Date   APPENDECTOMY     BOWEL RESECTION     CHOLECYSTECTOMY     COLONOSCOPY     cyst removed from neck     RADIOFREQUENCY ABLATION N/A 10/18/2018   Procedure: MICROWAVE THERMAL ABLATION AND BIOPSY;  Surgeon: Jacqulynn Cadet, MD;  Location: [facility];  Service: Anesthesiology;  Laterality: N/A;   SKIN LESION EXCISION     multiple   TOTAL KNEE ARTHROPLASTY Left 11/19/2015   Procedure: TOTAL KNEE ARTHROPLASTY;  Surgeon: Ninetta Lights, MD;  Location: [facility];  Service: Orthopedics;  Laterality: Left;    Allergies: Hydrocodone-acetaminophen  Medications: Prior to Admission medications   Medication Sig Start Date End Date Taking? Authorizing Provider  aspirin EC 81 MG tablet Take 81 mg by mouth daily.    [provider]  dextromethorphan-guaiFENesin (MUCINEX DM) 30-600 MG 12hr tablet Take 1 tablet by mouth 2 (two) times daily.    [provider]  diphenhydrAMINE (BENADRYL) 25 mg capsule Take 25 mg by mouth every 6 (six) hours as needed.    [provider]  donepezil (ARICEPT) 10 MG tablet Take 10 mg by mouth daily.    [provider]  finasteride (PROSCAR) 5 MG tablet Take 5 mg by mouth daily.    [provider]  lisinopril (ZESTRIL) 20 MG tablet Take 20  mg by mouth daily.  09/21/15   [provider]  meclizine (ANTIVERT) 25 MG tablet Take 25 mg by mouth 3 (three) times daily as needed for dizziness.    [provider]  memantine (NAMENDA) 10 MG tablet Take 10 mg by mouth 2 (two) times daily.    [provider]  Multiple Vitamin (MULTIVITAMIN WITH MINERALS) TABS tablet Take 1 tablet by mouth daily.    [provider]  tamsulosin (FLOMAX) 0.4 MG CAPS capsule Take 0.4 mg by mouth 2  (two) times daily.    [provider]     Family History  Problem Relation Age of Onset   Breast cancer Mother    Hypertension Father    Diabetes Father    Aneurysm Father    Breast cancer Sister    Alcohol abuse Brother     Social History   Socioeconomic History   Marital status: Married    Spouse name: Not on file   Number of children: Not on file   Years of education: Not on file   Highest education level: Not on file  Occupational History   Not on file  Social Needs   Financial resource strain: Not on file   Food insecurity    Worry: Not on file    Inability: Not on file   Transportation needs    Medical: Not on file    Non-medical: Not on file  Tobacco Use   Smoking status: Never Smoker   Smokeless tobacco: Never Used  Substance and Sexual Activity   Alcohol use: No   Drug use: No   Sexual activity: Not on file  Lifestyle   Physical activity    Days per week: Not on file    Minutes per session: Not on file   Stress: Not on file  Relationships   Social connections    Talks on phone: Not on file    Gets together: Not on file    Attends religious service: Not on file    Active member of club or organization: Not on file    Attends meetings of clubs or organizations: Not on file    Relationship status: Not on file  Other Topics Concern   Not on file  Social History Narrative   Not on file    ECOG Status: 0 - Asymptomatic  Review of Systems  Review of Systems: A 12 point ROS discussed and pertinent positives are indicated in the HPI above.  All other systems are negative.  Physical Exam No direct physical exam was performed (except for noted visual exam findings with Video Visits).   Vital Signs: There were no vitals taken for this visit.  Imaging: Ct Guide Tissue Ablation  Result Date: 10/18/2018 INDICATION: 83 year old male with an enhancing tumor exophytic from the interpolar left kidney highly concerning for  renal cell carcinoma. He presents for CT-guided biopsy and concurrent percutaneous thermal ablation. EXAM: 1. CT-guided core needle biopsy 2. CT-guided percutaneous thermal ablation COMPARISON:  MRI abdomen 07/03/2018 MEDICATIONS: 2 g Ancef; The antibiotic was administered in an appropriate time interval prior to needle puncture of the skin. ANESTHESIA/SEDATION: General - as administered by the Anesthesia department FLUOROSCOPY TIME:  None. COMPLICATIONS: None immediate. TECHNIQUE: Informed written consent was obtained from the patient after a thorough discussion of the procedural risks, benefits and alternatives. All questions were addressed. Maximal Sterile Barrier Technique was utilized including caps, mask, sterile gowns, sterile gloves, sterile drape, hand hygiene and skin antiseptic. A timeout was  performed prior to the initiation of the procedure. A planning axial CT scan was performed. The lesion in the interpolar left kidney was successfully identified. There has been no significant interval growth compared to the prior imaging. Appropriate skin entry site was selected and marked. Sterile prep and drape was then performed in the standard fashion using chlorhexidine skin prep. A small dermatotomy was made. Under intermittent CT guidance, a 15 cm PR probe was advanced through the lesion at the interface with the renal parenchyma. Once the probe was satisfactorily positioned, a 17 gauge introducer needle was then advanced and parallel fashion and positioned toward the more exophytic portion of the lesion. Once in position, two 18 gauge core biopsies were obtained coaxially using the bio Pince automated biopsy device. The biopsy introducer needle was then removed. Percutaneous thermal ablation was then performed. The probe was powered at 65 watts for 5 minutes. Following complete ablation, the probe was removed. Follow-up CT imaging enhanced with intravenous contrast demonstrates an excellent ablation zone  completely encompassing the lesion. No evidence of bleeding or other complication. FINDINGS: Technically successful biopsy and percutaneous thermal ablation. IMPRESSION: 1. Technically successful CT-guided core biopsy of left renal lesion. 2. Technically successful CT-guided percutaneous thermal ablation of left renal lesion. Signed, Criselda Peaches, MD, [facility] Radiology Electronically Signed   By: Jacqulynn Cadet M.D.   On: 10/18/2018 12:57   Ct Biopsy  Result Date: 10/18/2018 INDICATION: 83 year old male with an enhancing tumor exophytic from the interpolar left kidney highly concerning for renal cell carcinoma. He presents for CT-guided biopsy and concurrent percutaneous thermal ablation. EXAM: 1. CT-guided core needle biopsy 2. CT-guided percutaneous thermal ablation COMPARISON:  MRI abdomen 07/03/2018 MEDICATIONS: 2 g Ancef; The antibiotic was administered in an appropriate time interval prior to needle puncture of the skin. ANESTHESIA/SEDATION: General - as administered by the Anesthesia department FLUOROSCOPY TIME:  None. COMPLICATIONS: None immediate. TECHNIQUE: Informed written consent was obtained from the patient after a thorough discussion of the procedural risks, benefits and alternatives. All questions were addressed. Maximal Sterile Barrier Technique was utilized including caps, mask, sterile gowns, sterile gloves, sterile drape, hand hygiene and skin antiseptic. A timeout was performed prior to the initiation of the procedure. A planning axial CT scan was performed. The lesion in the interpolar left kidney was successfully identified. There has been no significant interval growth compared to the prior imaging. Appropriate skin entry site was selected and marked. Sterile prep and drape was then performed in the standard fashion using chlorhexidine skin prep. A small dermatotomy was made. Under intermittent CT guidance, a 15 cm PR  probe was advanced through the lesion at the interface with the renal parenchyma. Once the probe was satisfactorily positioned, a 17 gauge introducer needle was then advanced and parallel fashion and positioned toward the more exophytic portion of the lesion. Once in position, two 18 gauge core biopsies were obtained coaxially using the bio Pince automated biopsy device. The biopsy introducer needle was then removed. Percutaneous thermal ablation was then performed. The probe was powered at 65 watts for 5 minutes. Following complete ablation, the probe was removed. Follow-up CT imaging enhanced with intravenous contrast demonstrates an excellent ablation zone completely encompassing the lesion. No evidence of bleeding or other complication. FINDINGS: Technically successful biopsy and percutaneous thermal ablation. IMPRESSION: 1. Technically successful CT-guided core biopsy of left renal lesion. 2. Technically successful CT-guided percutaneous thermal ablation of left renal lesion. Signed, Criselda Peaches, MD, [facility] Radiology Electronically Signed   By: Jacqulynn Cadet M.D.   On: 10/18/2018 12:57    Labs:  CBC: Recent Labs    10/11/18 1131 10/18/18 0720 10/19/18 0428  WBC 8.9 8.8 18.1*  HGB 14.9 14.0 13.3  HCT 45.0 42.4 40.1  PLT 264 249 257    COAGS: Recent Labs    10/18/18 0720  INR 1.0  APTT 30    BMP: Recent Labs    10/11/18 1131 10/18/18 0720 10/19/18 0428  NA 138 139 138  K 4.7 4.0 4.5  CL 107 107 105  CO2 24 22 24   GLUCOSE 108* 112* 131*  BUN 19 19 15   CALCIUM 9.3 8.8* 8.7*  CREATININE 1.02 0.97 1.12  GFRNONAA >60 >60 >60  GFRAA >60 >60 >60    LIVER FUNCTION TESTS: Recent Labs    10/18/18 0720  BILITOT 0.9  AST 21  ALT 27  ALKPHOS 47  PROT 6.8  ALBUMIN 4.1    TUMOR MARKERS: No results for input(s): AFPTM, CEA, CA199, CHROMGRNA in the last 8760 hours.  Assessment and Plan:  Javier Hall  is doing very well following his percutaneous thermal ablation of his biopsy-proven left renal cell carcinoma.  Pathology was most consistent with chromophobe subtype.  We discussed his pathology results and outlined future surveillance.  Questions were answered.  He is very pleased with the ease of his recovery.  1.)  Follow-up gadolinium enhanced MRI, basic metabolic panel and clinic visit in 6 months.   Electronically Signed: Jacqulynn Cadet 11/14/2018, 11:28 AM   I spent a total of  15 Minutes in remote  clinical consultation, greater than 50% of which was counseling/coordinating care for left chromophobe RCC.    Visit type: Audio only (telephone). Audio (no video) only due to patient preference. Alternative for in-person consultation at [facility] [address] [place], [place], [place]. This visit type was conducted due to national recommendations for restrictions regarding the COVID-19 Pandemic (e.g. social distancing).  This format is felt to be most appropriate for this patient at this time.  All issues noted in this document were discussed and addressed.

## 2018-11-20 DIAGNOSIS — I493 Ventricular premature depolarization: Secondary | ICD-10-CM | POA: Diagnosis not present

## 2018-12-01 ENCOUNTER — Ambulatory Visit (INDEPENDENT_AMBULATORY_CARE_PROVIDER_SITE_OTHER): Payer: PPO | Admitting: Cardiology

## 2018-12-01 ENCOUNTER — Other Ambulatory Visit: Payer: Self-pay

## 2018-12-01 ENCOUNTER — Ambulatory Visit (INDEPENDENT_AMBULATORY_CARE_PROVIDER_SITE_OTHER): Payer: PPO

## 2018-12-01 ENCOUNTER — Encounter: Payer: Self-pay | Admitting: Cardiology

## 2018-12-01 VITALS — BP 130/62 | HR 53 | Ht 71.0 in | Wt 188.0 lb

## 2018-12-01 DIAGNOSIS — I493 Ventricular premature depolarization: Secondary | ICD-10-CM | POA: Diagnosis not present

## 2018-12-01 DIAGNOSIS — E782 Mixed hyperlipidemia: Secondary | ICD-10-CM | POA: Insufficient documentation

## 2018-12-01 DIAGNOSIS — I4719 Other supraventricular tachycardia: Secondary | ICD-10-CM | POA: Insufficient documentation

## 2018-12-01 DIAGNOSIS — I10 Essential (primary) hypertension: Secondary | ICD-10-CM

## 2018-12-01 DIAGNOSIS — I471 Supraventricular tachycardia: Secondary | ICD-10-CM | POA: Diagnosis not present

## 2018-12-01 HISTORY — DX: Mixed hyperlipidemia: E78.2

## 2018-12-01 HISTORY — DX: Supraventricular tachycardia: I47.1

## 2018-12-01 HISTORY — DX: Other supraventricular tachycardia: I47.19

## 2018-12-01 NOTE — Progress Notes (Signed)
Complete echocardiogram has been performed.  Jimmy Jamisen Hawes RDCS, RVT 

## 2018-12-01 NOTE — Patient Instructions (Signed)
Medication Instructions:  Your physician recommends that you continue on your current medications as directed. Please refer to the Current Medication list given to you today.  *If you need a refill on your cardiac medications before your next appointment, please call your pharmacy*  Lab Work: Your physician recommends that you return for lab work in:   Before next visit: Fasting Lipid   If you have labs (blood work) drawn today and your tests are completely normal, you will receive your results only by: Marland Kitchen MyChart Message (if you have MyChart) OR . A paper copy in the mail If you have any lab test that is abnormal or we need to change your treatment, we will call you to review the results.  Testing/Procedures: None  Follow-Up: At [facility], you and your health needs are our priority.  As part of our continuing mission to provide you with exceptional heart care, we have created designated Provider Care Teams.  These Care Teams include your primary Cardiologist (physician) and Advanced Practice Providers (APPs -  Physician Assistants and Nurse Practitioners) who all work together to provide you with the care you need, when you need it.  Your next appointment:   6 month(s)  The format for your next appointment:   In Person  Provider:   Berniece Salines, DO  Other Instructions

## 2018-12-01 NOTE — Progress Notes (Signed)
Cardiology Office Note:    Date:  12/01/2018   ID:  Javier Hall, DOB [birth year], MRN [id number]  PCP:  Helen Hashimoto., MD  Cardiologist:  Berniece Salines, DO  Electrophysiologist:  None   Referring MD: Helen Hashimoto., MD   Chief Complaint  Patient presents with  . Follow-up  = History of Present Illness:    Javier Hall is a 83 y.o. male with a hx of hypertension, hyperlipidemia PVC presented initially on October 31, 2018 to be evaluated.  At his visit he reported intermittent palpitations therefore I recommended patient undergo ZIO monitor as well as echocardiogram.  He is here today to discuss these results.   Past Medical History:  Diagnosis Date  . Alzheimer's dementia ([place])   . Arrhythmia   . Arthritis   . Dementia ([place])    mild  . Essential hypertension 03/11/2015  . Familial hemochromatosis ([place])   . History of kidney stones   . Hyperlipidemia   . Hypertension   . Mild cognitive impairment 10/03/2012  . Near syncope 03/11/2015  . PONV (postoperative nausea and vomiting)   . Renal malignant tumor, left ([place]) 10/18/2018  . S/P total knee replacement 11/19/2015    Past Surgical History:  Procedure Laterality Date  . APPENDECTOMY    . BOWEL RESECTION    . CHOLECYSTECTOMY    . COLONOSCOPY    . cyst removed from neck    . IR RADIOLOGIST EVAL & MGMT  11/14/2018  . RADIOFREQUENCY ABLATION N/A 10/18/2018   Procedure: MICROWAVE THERMAL ABLATION AND BIOPSY;  Surgeon: Jacqulynn Cadet, MD;  Location: [facility];  Service: Anesthesiology;  Laterality: N/A;  . SKIN LESION EXCISION     multiple  . TOTAL KNEE ARTHROPLASTY Left 11/19/2015   Procedure: TOTAL KNEE ARTHROPLASTY;  Surgeon: Ninetta Lights, MD;  Location: [facility];  Service: Orthopedics;  Laterality: Left;    Current Medications: Current Meds  Medication Sig  . aspirin EC 81 MG tablet Take 81 mg by mouth daily.  Marland Kitchen dextromethorphan-guaiFENesin (MUCINEX DM) 30-600 MG 12hr tablet Take 1  tablet by mouth 2 (two) times daily.  . diphenhydrAMINE (BENADRYL) 25 mg capsule Take 25 mg by mouth every 6 (six) hours as needed.  . donepezil (ARICEPT) 10 MG tablet Take 10 mg by mouth daily.  . finasteride (PROSCAR) 5 MG tablet Take 5 mg by mouth daily.  Marland Kitchen lisinopril (ZESTRIL) 20 MG tablet Take 20 mg by mouth daily.   . meclizine (ANTIVERT) 25 MG tablet Take 25 mg by mouth 3 (three) times daily as needed for dizziness.  . memantine (NAMENDA) 10 MG tablet Take 10 mg by mouth 2 (two) times daily.  . Multiple Vitamin (MULTIVITAMIN WITH MINERALS) TABS tablet Take 1 tablet by mouth daily.  . tamsulosin (FLOMAX) 0.4 MG CAPS capsule Take 0.4 mg by mouth 2 (two) times daily.     Allergies:   Hydrocodone-acetaminophen   Social History   Socioeconomic History  . Marital status: Married    Spouse name: Not on file  . Number of children: Not on file  . Years of education: Not on file  . Highest education level: Not on file  Occupational History  . Not on file  Social Needs  . Financial resource strain: Not on file  . Food insecurity    Worry: Not on file    Inability: Not on file  . Transportation needs    Medical: Not on file    Non-medical: Not on file  Tobacco Use  . Smoking status: Never Smoker  . Smokeless tobacco: Never Used  Substance and Sexual Activity  . Alcohol use: No  . Drug use: No  . Sexual activity: Not on file  Lifestyle  . Physical activity    Days per week: Not on file    Minutes per session: Not on file  . Stress: Not on file  Relationships  . Social Herbalist on phone: Not on file    Gets together: Not on file    Attends religious service: Not on file    Active member of club or organization: Not on file    Attends meetings of clubs or organizations: Not on file    Relationship status: Not on file  Other Topics Concern  . Not on file  Social History Narrative  . Not on file     Family History: The patient's family history includes  Alcohol abuse in his brother; Aneurysm in his father; Breast cancer in his mother and sister; Diabetes in his father; Hypertension in his father.  ROS:   Review of Systems  Constitution: Negative for decreased appetite, fever and weight gain.  HENT: Negative for congestion, ear discharge, hoarse voice and sore throat.   Eyes: Negative for discharge, redness, vision loss in right eye and visual halos.  Cardiovascular: Negative for chest pain, dyspnea on exertion, leg swelling, orthopnea and palpitations.  Respiratory: Negative for cough, hemoptysis, shortness of breath and snoring.   Endocrine: Negative for heat intolerance and polyphagia.  Hematologic/Lymphatic: Negative for bleeding problem. Does not bruise/bleed easily.  Skin: Negative for flushing, nail changes, rash and suspicious lesions.  Musculoskeletal: Negative for arthritis, joint pain, muscle cramps, myalgias, neck pain and stiffness.  Gastrointestinal: Negative for abdominal pain, bowel incontinence, diarrhea and excessive appetite.  Genitourinary: Negative for decreased libido, genital sores and incomplete emptying.  Neurological: Negative for brief paralysis, focal weakness, headaches and loss of balance.  Psychiatric/Behavioral: Negative for altered mental status, depression and suicidal ideas.  Allergic/Immunologic: Negative for HIV exposure and persistent infections.    EKGs/Labs/Other Studies Reviewed:    The following studies were reviewed today:   EKG: None today  Zio monitor The patient wore the monitor for 6 days and 20 hours. Indication: Premature ventricular complexes The minimum heart rate was 44 bpm, maximum heart rate 174 bpm, and average heart rate was 67 bpm. The predominant underlying rhythm was Sinus Rhythm. First Degree AV Block was present.  18 Supraventricular Tachycardia runs occurred, the run with the fastest interval lasting 8 beats with a maximum heat rate of 174 bpm, the longest lasting 16 beats  with an avg rate of 149 bpm.  Premature atrial complexes were rare (<1.0%). Premature ventricular complexes were rare (<1.0%).  Ventricular Bigeminy and Trigeminy were present. No ventricular tachycardia, No second or third degree AV block, No pause and no atrial fibrillation. Conclusion: This study is remarkable for SVT which is likely atrial tachycardia with variable block and symptomatic ventricular ectopy.  Please clinically correlate.  Recent Labs: 10/18/2018: ALT 27 10/19/2018: BUN 15; Creatinine, Ser 1.12; Hemoglobin 13.3; Platelets 257; Potassium 4.5; Sodium 138  Recent Lipid Panel No results found for: CHOL, TRIG, HDL, CHOLHDL, VLDL, LDLCALC, LDLDIRECT  Physical Exam:    VS:  BP 130/62 (BP Location: Right Arm, Patient Position: Sitting, Cuff Size: Normal)   Pulse (!) 53   Ht 5\' 11"  (1.803 m)   Wt 188 lb (85.3 kg)   SpO2 96%   BMI 26.22  kg/m     Wt Readings from Last 3 Encounters:  12/01/18 188 lb (85.3 kg)  10/31/18 189 lb (85.7 kg)  10/25/18 188 lb (85.3 kg)     GEN: Well nourished, well developed in no acute distress HEENT: Normal NECK: No JVD; No carotid bruits LYMPHATICS: No lymphadenopathy CARDIAC: S1S2 noted,RRR, no murmurs, rubs, gallops RESPIRATORY:  Clear to auscultation without rales, wheezing or rhonchi  ABDOMEN: Soft, non-tender, non-distended, +bowel sounds, no guarding. EXTREMITIES: No edema, No cyanosis, no clubbing MUSCULOSKELETAL:  No edema; No deformity  SKIN: Warm and dry NEUROLOGIC:  Alert and oriented x 3, non-focal PSYCHIATRIC:  Normal affect, good insight  ASSESSMENT:    1. Essential hypertension   2. Mixed hyperlipidemia   3. Paroxysmal atrial tachycardia (HCC)    PLAN:    1.  I discussed the patient report with him and his wife.  For now he denies any symptoms.  I will therefore continue to monitor patient closely.  We may need to start him on a low-dose beta-blocker however right now his heart rate has been in the low 50s, not sure if  he will be able to tolerate this medication.   2.  I was able to review his echocardiogram in the office preliminarily show a normal left ventricular systolic function with mild LVH with no valvular abnormalities.   The patient is in agreement with the above plan. The patient left the office in stable condition.  The patient will follow up in 3 to 6 months.   Medication Adjustments/Labs and Tests Ordered: Current medicines are reviewed at length with the patient today.  Concerns regarding medicines are outlined above.  Orders Placed This Encounter  Procedures  . Lipid Profile   No orders of the defined types were placed in this encounter.   Patient Instructions  Medication Instructions:  Your physician recommends that you continue on your current medications as directed. Please refer to the Current Medication list given to you today.  *If you need a refill on your cardiac medications before your next appointment, please call your pharmacy*  Lab Work: Your physician recommends that you return for lab work in:   Before next visit: Fasting Lipid   If you have labs (blood work) drawn today and your tests are completely normal, you will receive your results only by: Marland Kitchen MyChart Message (if you have MyChart) OR . A paper copy in the mail If you have any lab test that is abnormal or we need to change your treatment, we will call you to review the results.  Testing/Procedures: None  Follow-Up: At [facility], you and your health needs are our priority.  As part of our continuing mission to provide you with exceptional heart care, we have created designated Provider Care Teams.  These Care Teams include your primary Cardiologist (physician) and Advanced Practice Providers (APPs -  Physician Assistants and Nurse Practitioners) who all work together to provide you with the care you need, when you need it.  Your next appointment:   6 month(s)  The format for your next appointment:    In Person  Provider:   Berniece Salines, DO  Other Instructions      Adopting a Healthy Lifestyle.  Know what a healthy weight is for you (roughly BMI <25) and aim to maintain this   Aim for 7+ servings of fruits and vegetables daily   65-80+ fluid ounces of water or unsweet tea for healthy kidneys   Limit to max 1 drink  of alcohol per day; avoid smoking/tobacco   Limit animal fats in diet for cholesterol and heart health - choose grass fed whenever available   Avoid highly processed foods, and foods high in saturated/trans fats   Aim for low stress - take time to unwind and care for your mental health   Aim for 150 min of moderate intensity exercise weekly for heart health, and weights twice weekly for bone health   Aim for 7-9 hours of sleep daily   When it comes to diets, agreement about the perfect plan isnt easy to find, even among the experts. Experts at the [employer] developed an idea known as the Healthy Eating Plate. Just imagine a plate divided into logical, healthy portions.   The emphasis is on diet quality:   Load up on vegetables and fruits - one-half of your plate: Aim for color and variety, and remember that potatoes dont count.   Go for whole grains - one-quarter of your plate: Whole wheat, barley, wheat berries, quinoa, oats, brown rice, and foods made with them. If you want pasta, go with whole wheat pasta.   Protein power - one-quarter of your plate: Fish, chicken, beans, and nuts are all healthy, versatile protein sources. Limit red meat.   The diet, however, does go beyond the plate, offering a few other suggestions.   Use healthy plant oils, such as olive, canola, soy, corn, sunflower and peanut. Check the labels, and avoid partially hydrogenated oil, which have unhealthy trans fats.   If youre thirsty, drink water. Coffee and tea are good in moderation, but skip sugary drinks and limit milk and dairy products to one or two  daily servings.   The type of carbohydrate in the diet is more important than the amount. Some sources of carbohydrates, such as vegetables, fruits, whole grains, and beans-are healthier than others.   Finally, stay active  Signed, Berniece Salines, DO  12/01/2018 4:16 PM    [facility]

## 2019-02-01 DIAGNOSIS — N2889 Other specified disorders of kidney and ureter: Secondary | ICD-10-CM | POA: Diagnosis not present

## 2019-02-01 DIAGNOSIS — N401 Enlarged prostate with lower urinary tract symptoms: Secondary | ICD-10-CM | POA: Diagnosis not present

## 2019-02-01 DIAGNOSIS — R972 Elevated prostate specific antigen [PSA]: Secondary | ICD-10-CM | POA: Diagnosis not present

## 2019-03-05 ENCOUNTER — Ambulatory Visit (INDEPENDENT_AMBULATORY_CARE_PROVIDER_SITE_OTHER): Payer: PPO | Admitting: Cardiology

## 2019-03-05 ENCOUNTER — Encounter: Payer: Self-pay | Admitting: Cardiology

## 2019-03-05 ENCOUNTER — Other Ambulatory Visit: Payer: Self-pay

## 2019-03-05 VITALS — BP 140/80 | HR 68 | Ht 71.0 in | Wt 192.0 lb

## 2019-03-05 DIAGNOSIS — I10 Essential (primary) hypertension: Secondary | ICD-10-CM | POA: Diagnosis not present

## 2019-03-05 DIAGNOSIS — I471 Supraventricular tachycardia: Secondary | ICD-10-CM | POA: Diagnosis not present

## 2019-03-05 DIAGNOSIS — I493 Ventricular premature depolarization: Secondary | ICD-10-CM

## 2019-03-05 HISTORY — DX: Ventricular premature depolarization: I49.3

## 2019-03-05 NOTE — Patient Instructions (Signed)
Medication Instructions:  Your physician recommends that you continue on your current medications as directed. Please refer to the Current Medication list given to you today.  *If you need a refill on your cardiac medications before your next appointment, please call your pharmacy*  Lab Work: NONE If you have labs (blood work) drawn today and your tests are completely normal, you will receive your results only by: Marland Kitchen MyChart Message (if you have MyChart) OR . A paper copy in the mail If you have any lab test that is abnormal or we need to change your treatment, we will call you to review the results.  Testing/Procedures: NONE  Follow-Up: At [facility], you and your health needs are our priority.  As part of our continuing mission to provide you with exceptional heart care, we have created designated Provider Care Teams.  These Care Teams include your primary Cardiologist (physician) and Advanced Practice Providers (APPs -  Physician Assistants and Nurse Practitioners) who all work together to provide you with the care you need, when you need it.  Your next appointment:   6 month(s)  The format for your next appointment:     Provider:   Berniece Salines, DO  Other Instructions

## 2019-03-05 NOTE — Progress Notes (Signed)
Cardiology Office Note:    Date:  03/05/2019   ID:  Javier Hall, DOB 1935-11-12, MRN [id number]  PCP:  Helen Hashimoto., MD  Cardiologist:  Berniece Salines, DO  Electrophysiologist:  None   Referring MD: Helen Hashimoto., MD   Follow up hypertension, PVCs,  History of Present Illness:    Javier Hall is a 84 y.o. male with a hx of hypertension, hyperlipidemia PVC presented for a follow up visit. I last saw the patient on 12/01/2018 at that time he we discussed his Zio monitor results.   Today he tells me that his symptoms of palpitation has improved significantly.  Past Medical History:  Diagnosis Date  . Alzheimer's dementia ([place])   . Arrhythmia   . Arthritis   . Dementia ([place])    mild  . Essential hypertension 03/11/2015  . Familial hemochromatosis ([place])   . History of kidney stones   . Hyperlipidemia   . Hypertension   . Mild cognitive impairment 10/03/2012  . Near syncope 03/11/2015  . PONV (postoperative nausea and vomiting)   . Renal malignant tumor, left ([place]) 10/18/2018  . S/P total knee replacement 11/19/2015    Past Surgical History:  Procedure Laterality Date  . APPENDECTOMY    . BOWEL RESECTION    . CHOLECYSTECTOMY    . COLONOSCOPY    . cyst removed from neck    . IR RADIOLOGIST EVAL & MGMT  11/14/2018  . RADIOFREQUENCY ABLATION N/A 10/18/2018   Procedure: MICROWAVE THERMAL ABLATION AND BIOPSY;  Surgeon: Jacqulynn Cadet, MD;  Location: [facility];  Service: Anesthesiology;  Laterality: N/A;  . SKIN LESION EXCISION     multiple  . TOTAL KNEE ARTHROPLASTY Left 11/19/2015   Procedure: TOTAL KNEE ARTHROPLASTY;  Surgeon: Ninetta Lights, MD;  Location: [place];  Service: Orthopedics;  Laterality: Left;    Current Medications: Current Meds  Medication Sig  . aspirin EC 81 MG tablet Take 81 mg by mouth daily.  . diphenhydrAMINE (BENADRYL) 25 mg capsule Take 25 mg by mouth every 6 (six) hours as needed.  . donepezil (ARICEPT) 10 MG tablet  Take 10 mg by mouth daily.  . finasteride (PROSCAR) 5 MG tablet Take 5 mg by mouth daily.  Marland Kitchen lisinopril (ZESTRIL) 20 MG tablet Take 20 mg by mouth daily.   . memantine (NAMENDA) 10 MG tablet Take 10 mg by mouth 2 (two) times daily.  . Multiple Vitamin (MULTIVITAMIN WITH MINERALS) TABS tablet Take 1 tablet by mouth daily.  . tamsulosin (FLOMAX) 0.4 MG CAPS capsule Take 0.4 mg by mouth 2 (two) times daily.     Allergies:   Hydrocodone-acetaminophen   Social History   Socioeconomic History  . Marital status: Married    Spouse name: Not on file  . Number of children: Not on file  . Years of education: Not on file  . Highest education level: Not on file  Occupational History  . Not on file  Tobacco Use  . Smoking status: Never Smoker  . Smokeless tobacco: Never Used  Substance and Sexual Activity  . Alcohol use: No  . Drug use: No  . Sexual activity: Not on file  Other Topics Concern  . Not on file  Social History Narrative  . Not on file   Social Determinants of Health   Financial Resource Strain:   . Difficulty of Paying Living Expenses: Not on file  Food Insecurity:   . Worried About Charity fundraiser in the Last Year:  Not on file  . Ran Out of Food in the Last Year: Not on file  Transportation Needs:   . Lack of Transportation (Medical): Not on file  . Lack of Transportation (Non-Medical): Not on file  Physical Activity:   . Days of Exercise per Week: Not on file  . Minutes of Exercise per Session: Not on file  Stress:   . Feeling of Stress : Not on file  Social Connections:   . Frequency of Communication with Friends and Family: Not on file  . Frequency of Social Gatherings with Friends and Family: Not on file  . Attends Religious Services: Not on file  . Active Member of Clubs or Organizations: Not on file  . Attends Archivist Meetings: Not on file  . Marital Status: Not on file     Family History: The patient's family history includes Alcohol  abuse in his brother; Aneurysm in his father; Breast cancer in his mother and sister; Diabetes in his father; Hypertension in his father.  ROS:   Review of Systems  Constitution: Negative for decreased appetite, fever and weight gain.  HENT: Negative for congestion, ear discharge, hoarse voice and sore throat.   Eyes: Negative for discharge, redness, vision loss in right eye and visual halos.  Cardiovascular: Negative for chest pain, dyspnea on exertion, leg swelling, orthopnea and palpitations.  Respiratory: Negative for cough, hemoptysis, shortness of breath and snoring.   Endocrine: Negative for heat intolerance and polyphagia.  Hematologic/Lymphatic: Negative for bleeding problem. Does not bruise/bleed easily.  Skin: Negative for flushing, nail changes, rash and suspicious lesions.  Musculoskeletal: Negative for arthritis, joint pain, muscle cramps, myalgias, neck pain and stiffness.  Gastrointestinal: Negative for abdominal pain, bowel incontinence, diarrhea and excessive appetite.  Genitourinary: Negative for decreased libido, genital sores and incomplete emptying.  Neurological: Negative for brief paralysis, focal weakness, headaches and loss of balance.  Psychiatric/Behavioral: Negative for altered mental status, depression and suicidal ideas.  Allergic/Immunologic: Negative for HIV exposure and persistent infections.    EKGs/Labs/Other Studies Reviewed:    The following studies were reviewed today:   EKG:  None today  Zio monitor The patient wore the monitor for 6 days and 20 hours. Indication: Premature ventricular complexes The minimum heart rate was 44 bpm,maximum heart rate 174 bpm, and average heart rate was 67 bpm. The predominant underlying rhythm was Sinus Rhythm. First Degree AV Block was present.  18 Supraventricular Tachycardia runs occurred, the run with the fastest interval lasting 8 beats with a maximum heat rate of 174 bpm, the longest lasting 16 beats with  an avg rate of 149 bpm.  Premature atrial complexes were rare (<1.0%). Premature ventricular complexes were rare (<1.0%). Ventricular Bigeminy and Trigeminy were present. No ventricular tachycardia, No second or third degree AV block, No pause and no atrial fibrillation. Conclusion: This study is remarkable for SVT which is likely atrial tachycardia with variable block and symptomatic ventricular ectopy. Please clinically correlate.  Recent Labs: 10/18/2018: ALT 27 10/19/2018: BUN 15; Creatinine, Ser 1.12; Hemoglobin 13.3; Platelets 257; Potassium 4.5; Sodium 138  Recent Lipid Panel No results found for: CHOL, TRIG, HDL, CHOLHDL, VLDL, LDLCALC, LDLDIRECT  Physical Exam:    VS:  BP 140/80   Pulse 68   Ht 5\' 11"  (1.803 m)   Wt 192 lb (87.1 kg)   BMI 26.78 kg/m     Wt Readings from Last 3 Encounters:  03/05/19 192 lb (87.1 kg)  12/01/18 188 lb (85.3 kg)  10/31/18 189  lb (85.7 kg)     GEN: Well nourished, well developed in no acute distress HEENT: Normal NECK: No JVD; No carotid bruits LYMPHATICS: No lymphadenopathy CARDIAC: S1S2 noted,RRR, no murmurs, rubs, gallops RESPIRATORY:  Clear to auscultation without rales, wheezing or rhonchi  ABDOMEN: Soft, non-tender, non-distended, +bowel sounds, no guarding. EXTREMITIES: No edema, No cyanosis, no clubbing MUSCULOSKELETAL:  No deformity  SKIN: Warm and dry NEUROLOGIC:  Alert and oriented x 3, non-focal PSYCHIATRIC:  Normal affect, good insight  ASSESSMENT:    1. Essential hypertension   2. Paroxysmal atrial tachycardia ([place])   3. Symptomatic PVCs    PLAN:    Patient appears to be doing well from a cardiovascular standpoint.  No medication changes will be made today.  His blood pressure is acceptable.  The patient is in agreement with the above plan. The patient left the office in stable condition.  The patient will follow up in 6 months or sooner if needed.   Medication Adjustments/Labs and Tests Ordered: Current  medicines are reviewed at length with the patient today.  Concerns regarding medicines are outlined above.  No orders of the defined types were placed in this encounter.  No orders of the defined types were placed in this encounter.   Patient Instructions  Medication Instructions:  Your physician recommends that you continue on your current medications as directed. Please refer to the Current Medication list given to you today.  *If you need a refill on your cardiac medications before your next appointment, please call your pharmacy*  Lab Work: NONE If you have labs (blood work) drawn today and your tests are completely normal, you will receive your results only by: Marland Kitchen MyChart Message (if you have MyChart) OR . A paper copy in the mail If you have any lab test that is abnormal or we need to change your treatment, we will call you to review the results.  Testing/Procedures: NONE  Follow-Up: At [facility], you and your health needs are our priority.  As part of our continuing mission to provide you with exceptional heart care, we have created designated Provider Care Teams.  These Care Teams include your primary Cardiologist (physician) and Advanced Practice Providers (APPs -  Physician Assistants and Nurse Practitioners) who all work together to provide you with the care you need, when you need it.  Your next appointment:   6 month(s)  The format for your next appointment:     Provider:   Berniece Salines, DO  Other Instructions      Adopting a Healthy Lifestyle.  Know what a healthy weight is for you (roughly BMI <25) and aim to maintain this   Aim for 7+ servings of fruits and vegetables daily   65-80+ fluid ounces of water or unsweet tea for healthy kidneys   Limit to max 1 drink of alcohol per day; avoid smoking/tobacco   Limit animal fats in diet for cholesterol and heart health - choose grass fed whenever available   Avoid highly processed foods, and foods high  in saturated/trans fats   Aim for low stress - take time to unwind and care for your mental health   Aim for 150 min of moderate intensity exercise weekly for heart health, and weights twice weekly for bone health   Aim for 7-9 hours of sleep daily   When it comes to diets, agreement about the perfect plan isnt easy to find, even among the experts. Experts at the [employer] developed an  idea known as the Healthy Eating Plate. Just imagine a plate divided into logical, healthy portions.   The emphasis is on diet quality:   Load up on vegetables and fruits - one-half of your plate: Aim for color and variety, and remember that potatoes dont count.   Go for whole grains - one-quarter of your plate: Whole wheat, barley, wheat berries, quinoa, oats, brown rice, and foods made with them. If you want pasta, go with whole wheat pasta.   Protein power - one-quarter of your plate: Fish, chicken, beans, and nuts are all healthy, versatile protein sources. Limit red meat.   The diet, however, does go beyond the plate, offering a few other suggestions.   Use healthy plant oils, such as olive, canola, soy, corn, sunflower and peanut. Check the labels, and avoid partially hydrogenated oil, which have unhealthy trans fats.   If youre thirsty, drink water. Coffee and tea are good in moderation, but skip sugary drinks and limit milk and dairy products to one or two daily servings.   The type of carbohydrate in the diet is more important than the amount. Some sources of carbohydrates, such as vegetables, fruits, whole grains, and beans-are healthier than others.   Finally, stay active  Signed, Berniece Salines, DO  03/05/2019 1:35 PM    [facility]

## 2019-05-04 ENCOUNTER — Other Ambulatory Visit: Payer: Self-pay | Admitting: Interventional Radiology

## 2019-05-04 ENCOUNTER — Other Ambulatory Visit: Payer: Self-pay

## 2019-05-04 DIAGNOSIS — N2889 Other specified disorders of kidney and ureter: Secondary | ICD-10-CM

## 2019-05-07 ENCOUNTER — Other Ambulatory Visit: Payer: Self-pay

## 2019-05-07 DIAGNOSIS — N2889 Other specified disorders of kidney and ureter: Secondary | ICD-10-CM

## 2019-05-10 DIAGNOSIS — N2889 Other specified disorders of kidney and ureter: Secondary | ICD-10-CM | POA: Diagnosis not present

## 2019-05-15 DIAGNOSIS — N2889 Other specified disorders of kidney and ureter: Secondary | ICD-10-CM | POA: Diagnosis not present

## 2019-05-15 DIAGNOSIS — K760 Fatty (change of) liver, not elsewhere classified: Secondary | ICD-10-CM | POA: Diagnosis not present

## 2019-05-22 ENCOUNTER — Encounter: Payer: Self-pay | Admitting: *Deleted

## 2019-05-22 ENCOUNTER — Other Ambulatory Visit: Payer: Self-pay

## 2019-05-22 ENCOUNTER — Ambulatory Visit
Admission: RE | Admit: 2019-05-22 | Discharge: 2019-05-22 | Disposition: A | Discharge status: Home / Self Care | Payer: PPO | Source: Ambulatory Visit | Attending: Interventional Radiology | Admitting: Interventional Radiology

## 2019-05-22 DIAGNOSIS — C642 Malignant neoplasm of left kidney, except renal pelvis: Secondary | ICD-10-CM | POA: Diagnosis not present

## 2019-05-22 DIAGNOSIS — N2889 Other specified disorders of kidney and ureter: Secondary | ICD-10-CM

## 2019-05-22 DIAGNOSIS — Z9889 Other specified postprocedural states: Secondary | ICD-10-CM | POA: Diagnosis not present

## 2019-05-22 HISTORY — PX: IR RADIOLOGIST EVAL & MGMT: IMG5224

## 2019-05-22 NOTE — Progress Notes (Signed)
Chief Complaint: Patient was seen in follow-up remotely today (TeleHealth) for history of left renal cell carcinoma status post percutaneous thermal ablation at the request of [place].    Referring Physician(s): Joie Bimler, MD  History of Present Illness: Javier Hall is a 84 y.o. male who initially presented at the kind request of Dr. Nila Nephew to discuss minimally invasion of the options for treatment of a solid left renal mass. [name] was recently admitted with a urinary tract infection. Imaging demonstrated a small but enhancing solid mass arising from the interpolar left kidney. A follow-up MRI of the abdomen was performed confirming findings highly suspicious for a renal cell carcinoma. The mass measures 1.5 x 1.4 cm.    [name] underwent concomitant CT-guided biopsy and percutaneous thermal ablation of the renal lesion on 10/18/2018.  His postprocedural follow-up was uneventful and he recovered fully.  We are teleconferencing today to review his first follow-up with surveillance MRI imaging.  Today, [name] is in his usual state of health.  He denies any flank pain, hematuria, abdominal pain, nausea, vomiting or other systemic symptoms.  He was very excited to hear the good news that his MRI demonstrated a complete response to therapy without evidence of residual, recurrent or new renal lesions.   Past Medical History:  Diagnosis Date  . Alzheimer's dementia ([place])   . Arrhythmia   . Arthritis   . Dementia ([place])    mild  . Essential hypertension 03/11/2015  . Familial hemochromatosis ([place])   . History of kidney stones   . Hyperlipidemia   . Hypertension   . Mild cognitive impairment 10/03/2012  . Near syncope 03/11/2015  . PONV (postoperative nausea and vomiting)   . Renal malignant tumor, left ([place]) 10/18/2018  . S/P total knee replacement 11/19/2015    Past Surgical History:  Procedure Laterality Date  . APPENDECTOMY    . BOWEL RESECTION     . CHOLECYSTECTOMY    . COLONOSCOPY    . cyst removed from neck    . IR RADIOLOGIST EVAL & MGMT  11/14/2018  . RADIOFREQUENCY ABLATION N/A 10/18/2018   Procedure: MICROWAVE THERMAL ABLATION AND BIOPSY;  Surgeon: Jacqulynn Cadet, MD;  Location: [facility];  Service: Anesthesiology;  Laterality: N/A;  . SKIN LESION EXCISION     multiple  . TOTAL KNEE ARTHROPLASTY Left 11/19/2015   Procedure: TOTAL KNEE ARTHROPLASTY;  Surgeon: Ninetta Lights, MD;  Location: [place];  Service: Orthopedics;  Laterality: Left;    Allergies: Hydrocodone-acetaminophen  Medications: Prior to Admission medications   Medication Sig Start Date End Date Taking? Authorizing Provider  aspirin EC 81 MG tablet Take 81 mg by mouth daily.    [provider]  diphenhydrAMINE (BENADRYL) 25 mg capsule Take 25 mg by mouth every 6 (six) hours as needed.    [provider]  donepezil (ARICEPT) 10 MG tablet Take 10 mg by mouth daily.    [provider]  finasteride (PROSCAR) 5 MG tablet Take 5 mg by mouth daily.    [provider]  lisinopril (ZESTRIL) 20 MG tablet Take 20 mg by mouth daily.  09/21/15   [provider]  meclizine (ANTIVERT) 25 MG tablet Take 25 mg by mouth 3 (three) times daily as needed for dizziness.    [provider]  memantine (NAMENDA) 10 MG tablet Take 10 mg by mouth 2 (two) times daily.    [provider]  Multiple Vitamin (MULTIVITAMIN WITH MINERALS) TABS tablet Take  1 tablet by mouth daily.    [provider]  tamsulosin (FLOMAX) 0.4 MG CAPS capsule Take 0.4 mg by mouth 2 (two) times daily.    [provider]     Family History  Problem Relation Age of Onset  . Breast cancer Mother   . Hypertension Father   . Diabetes Father   . Aneurysm Father   . Breast cancer Sister   . Alcohol abuse Brother     Social History   Socioeconomic History  . Marital status: Married    Spouse name: Not on file  . Number of children:  Not on file  . Years of education: Not on file  . Highest education level: Not on file  Occupational History  . Not on file  Tobacco Use  . Smoking status: Never Smoker  . Smokeless tobacco: Never Used  Substance and Sexual Activity  . Alcohol use: No  . Drug use: No  . Sexual activity: Not on file  Other Topics Concern  . Not on file  Social History Narrative  . Not on file   Social Determinants of Health   Financial Resource Strain:   . Difficulty of Paying Living Expenses:   Food Insecurity:   . Worried About Charity fundraiser in the Last Year:   . Arboriculturist in the Last Year:   Transportation Needs:   . Film/video editor (Medical):   Marland Kitchen Lack of Transportation (Non-Medical):   Physical Activity:   . Days of Exercise per Week:   . Minutes of Exercise per Session:   Stress:   . Feeling of Stress :   Social Connections:   . Frequency of Communication with Friends and Family:   . Frequency of Social Gatherings with Friends and Family:   . Attends Religious Services:   . Active Member of Clubs or Organizations:   . Attends Archivist Meetings:   Marland Kitchen Marital Status:     ECOG Status: 0 - Asymptomatic  Review of Systems  Review of Systems: A 12 point ROS discussed and pertinent positives are indicated in the HPI above.  All other systems are negative.  Physical Exam No direct physical exam was performed (except for noted visual exam findings with Video Visits).   Vital Signs: There were no vitals taken for this visit.  Imaging: No results found.  Labs:  CBC: Recent Labs    10/11/18 1131 10/18/18 0720 10/19/18 0428  WBC 8.9 8.8 18.1*  HGB 14.9 14.0 13.3  HCT 45.0 42.4 40.1  PLT 264 249 257    COAGS: Recent Labs    10/18/18 0720  INR 1.0  APTT 30    BMP: Recent Labs    10/11/18 1131 10/18/18 0720 10/19/18 0428  NA 138 139 138  K 4.7 4.0 4.5  CL 107 107 105  CO2 24 22 24   GLUCOSE 108* 112* 131*  BUN 19 19 15     CALCIUM 9.3 8.8* 8.7*  CREATININE 1.02 0.97 1.12  GFRNONAA >60 >60 >60  GFRAA >60 >60 >60    LIVER FUNCTION TESTS: Recent Labs    10/18/18 0720  BILITOT 0.9  AST 21  ALT 27  ALKPHOS 47  PROT 6.8  ALBUMIN 4.1    TUMOR MARKERS: No results for input(s): AFPTM, CEA, CA199, CHROMGRNA in the last 8760 hours.  Assessment and Plan:  Mr. Radoncic is doing exceptionally well 6 months status post percutaneous thermal ablation of the left lower pole renal  lesion.  His surveillance MRI performed on 05/15/2019 demonstrates no evidence of residual disease and no new liver lesions.  Mr. Gregus was very excited to hear the good news.  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  1.)  Continue surveillance.  Repeat MRI with gadolinium contrast in 6 months with a follow-up clinic visit.   Electronically Signed: Jacqulynn Cadet 05/22/2019, 1:30 PM   I spent a total of  5 Minutes in remote clinical consultation, greater than 50% of which was counseling/coordinating care for history of left renal cell carcinoma.    Visit type: Audio only (telephone). Audio (no video) only due to patient's lack of internet/smartphone capability. Alternative for in-person consultation at [facility], [address] [place], [place], [place]. This visit type was conducted due to national recommendations for restrictions regarding the COVID-19 Pandemic (e.g. social distancing).  This format is felt to be most appropriate for this patient at this time.  All issues noted in this document were discussed and addressed.

## 2019-06-12 DIAGNOSIS — Z01818 Encounter for other preprocedural examination: Secondary | ICD-10-CM | POA: Diagnosis not present

## 2019-06-12 DIAGNOSIS — H2511 Age-related nuclear cataract, right eye: Secondary | ICD-10-CM | POA: Diagnosis not present

## 2019-06-12 DIAGNOSIS — H52223 Regular astigmatism, bilateral: Secondary | ICD-10-CM | POA: Diagnosis not present

## 2019-06-19 DIAGNOSIS — Z79899 Other long term (current) drug therapy: Secondary | ICD-10-CM | POA: Diagnosis not present

## 2019-06-19 DIAGNOSIS — N400 Enlarged prostate without lower urinary tract symptoms: Secondary | ICD-10-CM | POA: Diagnosis not present

## 2019-06-19 DIAGNOSIS — H52223 Regular astigmatism, bilateral: Secondary | ICD-10-CM | POA: Diagnosis not present

## 2019-06-19 DIAGNOSIS — G309 Alzheimer's disease, unspecified: Secondary | ICD-10-CM | POA: Diagnosis not present

## 2019-06-19 DIAGNOSIS — F0280 Dementia in other diseases classified elsewhere without behavioral disturbance: Secondary | ICD-10-CM | POA: Diagnosis not present

## 2019-06-19 DIAGNOSIS — H2511 Age-related nuclear cataract, right eye: Secondary | ICD-10-CM | POA: Diagnosis not present

## 2019-06-19 DIAGNOSIS — Z87891 Personal history of nicotine dependence: Secondary | ICD-10-CM | POA: Diagnosis not present

## 2019-06-19 DIAGNOSIS — I10 Essential (primary) hypertension: Secondary | ICD-10-CM | POA: Diagnosis not present

## 2019-06-19 DIAGNOSIS — H259 Unspecified age-related cataract: Secondary | ICD-10-CM | POA: Diagnosis not present

## 2019-06-19 DIAGNOSIS — Z7982 Long term (current) use of aspirin: Secondary | ICD-10-CM | POA: Diagnosis not present

## 2019-07-03 DIAGNOSIS — F0280 Dementia in other diseases classified elsewhere without behavioral disturbance: Secondary | ICD-10-CM | POA: Diagnosis not present

## 2019-07-03 DIAGNOSIS — Z87891 Personal history of nicotine dependence: Secondary | ICD-10-CM | POA: Diagnosis not present

## 2019-07-03 DIAGNOSIS — M19042 Primary osteoarthritis, left hand: Secondary | ICD-10-CM | POA: Diagnosis not present

## 2019-07-03 DIAGNOSIS — M19041 Primary osteoarthritis, right hand: Secondary | ICD-10-CM | POA: Diagnosis not present

## 2019-07-03 DIAGNOSIS — G309 Alzheimer's disease, unspecified: Secondary | ICD-10-CM | POA: Diagnosis not present

## 2019-07-03 DIAGNOSIS — I10 Essential (primary) hypertension: Secondary | ICD-10-CM | POA: Diagnosis not present

## 2019-07-03 DIAGNOSIS — H259 Unspecified age-related cataract: Secondary | ICD-10-CM | POA: Diagnosis not present

## 2019-07-03 DIAGNOSIS — Z79899 Other long term (current) drug therapy: Secondary | ICD-10-CM | POA: Diagnosis not present

## 2019-07-03 DIAGNOSIS — H2512 Age-related nuclear cataract, left eye: Secondary | ICD-10-CM | POA: Diagnosis not present

## 2019-07-03 DIAGNOSIS — Z7982 Long term (current) use of aspirin: Secondary | ICD-10-CM | POA: Diagnosis not present

## 2019-08-01 DIAGNOSIS — G309 Alzheimer's disease, unspecified: Secondary | ICD-10-CM | POA: Diagnosis not present

## 2019-08-01 DIAGNOSIS — Z79899 Other long term (current) drug therapy: Secondary | ICD-10-CM | POA: Diagnosis not present

## 2019-08-01 DIAGNOSIS — F0280 Dementia in other diseases classified elsewhere without behavioral disturbance: Secondary | ICD-10-CM | POA: Diagnosis not present

## 2019-08-01 DIAGNOSIS — N400 Enlarged prostate without lower urinary tract symptoms: Secondary | ICD-10-CM | POA: Diagnosis not present

## 2019-08-01 DIAGNOSIS — Z6827 Body mass index (BMI) 27.0-27.9, adult: Secondary | ICD-10-CM | POA: Diagnosis not present

## 2019-08-01 DIAGNOSIS — E785 Hyperlipidemia, unspecified: Secondary | ICD-10-CM | POA: Diagnosis not present

## 2019-08-01 DIAGNOSIS — I10 Essential (primary) hypertension: Secondary | ICD-10-CM | POA: Diagnosis not present

## 2019-08-12 HISTORY — PX: CATARACT EXTRACTION: SUR2

## 2019-09-14 DIAGNOSIS — N401 Enlarged prostate with lower urinary tract symptoms: Secondary | ICD-10-CM | POA: Diagnosis not present

## 2019-09-14 DIAGNOSIS — R972 Elevated prostate specific antigen [PSA]: Secondary | ICD-10-CM | POA: Diagnosis not present

## 2019-09-14 DIAGNOSIS — R310 Gross hematuria: Secondary | ICD-10-CM | POA: Diagnosis not present

## 2019-09-14 DIAGNOSIS — N2889 Other specified disorders of kidney and ureter: Secondary | ICD-10-CM | POA: Diagnosis not present

## 2019-09-18 ENCOUNTER — Ambulatory Visit: Payer: PPO | Admitting: Cardiology

## 2019-09-18 ENCOUNTER — Other Ambulatory Visit: Payer: Self-pay

## 2019-09-18 ENCOUNTER — Encounter: Payer: Self-pay | Admitting: Cardiology

## 2019-09-18 VITALS — BP 130/62 | HR 66 | Ht 71.0 in | Wt 185.0 lb

## 2019-09-18 DIAGNOSIS — E782 Mixed hyperlipidemia: Secondary | ICD-10-CM | POA: Diagnosis not present

## 2019-09-18 DIAGNOSIS — R0789 Other chest pain: Secondary | ICD-10-CM | POA: Diagnosis not present

## 2019-09-18 DIAGNOSIS — I10 Essential (primary) hypertension: Secondary | ICD-10-CM

## 2019-09-18 DIAGNOSIS — I493 Ventricular premature depolarization: Secondary | ICD-10-CM | POA: Diagnosis not present

## 2019-09-18 DIAGNOSIS — I471 Supraventricular tachycardia: Secondary | ICD-10-CM | POA: Diagnosis not present

## 2019-09-18 NOTE — Progress Notes (Signed)
Cardiology Office Note:    Date:  09/18/2019   ID:  Javier Hall, DOB 05/17/1935, MRN [id number]  PCP:  Helen Hashimoto., MD  Cardiologist:  Berniece Salines, DO  Electrophysiologist:  None   Referring MD: Helen Hashimoto., MD   Chief Complaint  Patient presents with  . Follow-up    History of Present Illness:    Javier Mcilhenny Laughlinis a 84 y.o.malewith a hx of hypertension, hyperlipidemia PVC presented for a follow up visit.  I last saw the patient on March 05, 2019 at that time.  He says ever since that time he has been doing well up until recently by 2 weeks ago he had his wife separated.  Does not cause a lot of stress on him.  He is having to have chest pain which she described as sometimes when he bent over to tie his shoes and when he sits up this feels better.  He denies any shortness of breath.  Past Medical History:  Diagnosis Date  . Alzheimer's dementia ([place])   . Arrhythmia   . Arthritis   . Dementia ([place])    mild  . Essential hypertension 03/11/2015  . Familial hemochromatosis ([place])   . History of kidney stones   . Hyperlipidemia   . Hypertension   . Mild cognitive impairment 10/03/2012  . Near syncope 03/11/2015  . PONV (postoperative nausea and vomiting)   . Renal malignant tumor, left ([place]) 10/18/2018  . S/P total knee replacement 11/19/2015    Past Surgical History:  Procedure Laterality Date  . APPENDECTOMY    . BOWEL RESECTION    . CATARACT EXTRACTION Bilateral 08/2019  . CHOLECYSTECTOMY    . COLONOSCOPY    . cyst removed from neck    . IR RADIOLOGIST EVAL & MGMT  11/14/2018  . IR RADIOLOGIST EVAL & MGMT  05/22/2019  . RADIOFREQUENCY ABLATION N/A 10/18/2018   Procedure: MICROWAVE THERMAL ABLATION AND BIOPSY;  Surgeon: Jacqulynn Cadet, MD;  Location: [facility];  Service: Anesthesiology;  Laterality: N/A;  . SKIN LESION EXCISION     multiple  . TOTAL KNEE ARTHROPLASTY Left 11/19/2015   Procedure: TOTAL KNEE ARTHROPLASTY;  Surgeon:  Ninetta Lights, MD;  Location: [facility];  Service: Orthopedics;  Laterality: Left;    Current Medications: Current Meds  Medication Sig  . aspirin EC 81 MG tablet Take 81 mg by mouth daily.  . diphenhydrAMINE (BENADRYL) 25 mg capsule Take 25 mg by mouth every 6 (six) hours as needed.  . donepezil (ARICEPT) 10 MG tablet Take 10 mg by mouth daily.  . finasteride (PROSCAR) 5 MG tablet Take 5 mg by mouth daily.  Marland Kitchen lisinopril (ZESTRIL) 20 MG tablet Take 20 mg by mouth daily.   . meclizine (ANTIVERT) 25 MG tablet Take 25 mg by mouth 3 (three) times daily as needed for dizziness.  . memantine (NAMENDA) 10 MG tablet Take 10 mg by mouth 2 (two) times daily.  . Multiple Vitamin (MULTIVITAMIN WITH MINERALS) TABS tablet Take 1 tablet by mouth daily.  . rosuvastatin (CRESTOR) 5 MG tablet Take 5 mg by mouth once a week.  . tamsulosin (FLOMAX) 0.4 MG CAPS capsule Take 0.4 mg by mouth 2 (two) times daily.     Allergies:   Hydrocodone-acetaminophen   Social History   Socioeconomic History  . Marital status: Married    Spouse name: Not on file  . Number of children: Not on file  . Years of education: Not on file  . Highest  education level: Not on file  Occupational History  . Not on file  Tobacco Use  . Smoking status: Never Smoker  . Smokeless tobacco: Never Used  Vaping Use  . Vaping Use: Never used  Substance and Sexual Activity  . Alcohol use: No  . Drug use: No  . Sexual activity: Not on file  Other Topics Concern  . Not on file  Social History Narrative  . Not on file   Social Determinants of Health   Financial Resource Strain:   . Difficulty of Paying Living Expenses: Not on file  Food Insecurity:   . Worried About Charity fundraiser in the Last Year: Not on file  . Ran Out of Food in the Last Year: Not on file  Transportation Needs:   . Lack of Transportation (Medical): Not on file  . Lack of Transportation (Non-Medical): Not on file  Physical Activity:   . Days of  Exercise per Week: Not on file  . Minutes of Exercise per Session: Not on file  Stress:   . Feeling of Stress : Not on file  Social Connections:   . Frequency of Communication with Friends and Family: Not on file  . Frequency of Social Gatherings with Friends and Family: Not on file  . Attends Religious Services: Not on file  . Active Member of Clubs or Organizations: Not on file  . Attends Archivist Meetings: Not on file  . Marital Status: Not on file     Family History: The patient's family history includes Alcohol abuse in his brother; Aneurysm in his father; Breast cancer in his mother and sister; Diabetes in his father; Hypertension in his father.  ROS:   Review of Systems  Constitution: Negative for decreased appetite, fever and weight gain.  HENT: Negative for congestion, ear discharge, hoarse voice and sore throat.   Eyes: Negative for discharge, redness, vision loss in right eye and visual halos.  Cardiovascular: Negative for chest pain, dyspnea on exertion, leg swelling, orthopnea and palpitations.  Respiratory: Negative for cough, hemoptysis, shortness of breath and snoring.   Endocrine: Negative for heat intolerance and polyphagia.  Hematologic/Lymphatic: Negative for bleeding problem. Does not bruise/bleed easily.  Skin: Negative for flushing, nail changes, rash and suspicious lesions.  Musculoskeletal: Negative for arthritis, joint pain, muscle cramps, myalgias, neck pain and stiffness.  Gastrointestinal: Negative for abdominal pain, bowel incontinence, diarrhea and excessive appetite.  Genitourinary: Negative for decreased libido, genital sores and incomplete emptying.  Neurological: Negative for brief paralysis, focal weakness, headaches and loss of balance.  Psychiatric/Behavioral: Negative for altered mental status, depression and suicidal ideas.  Allergic/Immunologic: Negative for HIV exposure and persistent infections.    EKGs/Labs/Other Studies  Reviewed:    The following studies were reviewed today:   EKG:  The ekg ordered today demonstrates sinus rhythm, heart rate 66 bpm.  Echo IMPRESSIONS November 2020 1. Left ventricular ejection fraction, by visual estimation, is 60 to  65%. The left ventricle has normal function. Left ventricular septal wall thickness was mildly increased. Mildly increased left ventricular posterior wall thickness. There is mildly increased left ventricular hypertrophy.  2. Left ventricular diastolic parameters are consistent with Grade II diastolic dysfunction (pseudonormalization).  3. Global right ventricle has normal systolic function.The right ventricular size is normal. No increase in right ventricular wall thickness.  4. Left atrial size was normal.  5. Right atrial size was normal.  6. The mitral valve is normal in structure. No evidence of mitral valve regurgitation.  No evidence of mitral stenosis.  7. The tricuspid valve is normal in structure. Tricuspid valve regurgitation is not demonstrated.  8. The aortic valve is normal in structure. Aortic valve regurgitation is not visualized. No evidence of aortic valve sclerosis or stenosis.  9. The pulmonic valve was not well visualized. Pulmonic valve regurgitation is not visualized.  10. There is mild dilatation of the ascending aorta measuring 37 mm.  11. Normal pulmonary artery systolic pressure.   Zio monitor The patient wore the monitor for 6 days and 20 hours. Indication: Premature ventricular complexes The minimum heart rate was 44 bpm,maximum heart rate 174 bpm, and average heart rate was 67 bpm. The predominant underlying rhythm was Sinus Rhythm. First Degree AV Block was present.  18 Supraventricular Tachycardia runs occurred, the run with the fastest interval lasting 8 beats with a maximum heat rate of 174 bpm, the longest lasting 16 beats with an avg rate of 149 bpm.  Premature atrial complexes were rare (<1.0%). Premature  ventricular complexes were rare (<1.0%). Ventricular Bigeminy and Trigeminy were present. No ventricular tachycardia, No second or third degree AV block, No pause and no atrial fibrillation. Conclusion: This study is remarkable for SVT which is likely atrial tachycardia with variable block and symptomatic ventricular ectopy. Please clinically correlate.  Recent Labs: 10/18/2018: ALT 27 10/19/2018: BUN 15; Creatinine, Ser 1.12; Hemoglobin 13.3; Platelets 257; Potassium 4.5; Sodium 138  Recent Lipid Panel No results found for: CHOL, TRIG, HDL, CHOLHDL, VLDL, LDLCALC, LDLDIRECT  Physical Exam:    VS:  BP 130/62 (BP Location: Right Arm, Patient Position: Sitting, Cuff Size: Normal)   Pulse 66   Ht 5\' 11"  (1.803 m)   Wt 185 lb (83.9 kg)   SpO2 97%   BMI 25.80 kg/m     Wt Readings from Last 3 Encounters:  09/18/19 185 lb (83.9 kg)  03/05/19 192 lb (87.1 kg)  12/01/18 188 lb (85.3 kg)     GEN: Well nourished, well developed in no acute distress HEENT: Normal NECK: No JVD; No carotid bruits LYMPHATICS: No lymphadenopathy CARDIAC: S1S2 noted,RRR, no murmurs, rubs, gallops RESPIRATORY:  Clear to auscultation without rales, wheezing or rhonchi  ABDOMEN: Soft, non-tender, non-distended, +bowel sounds, no guarding. EXTREMITIES: No edema, No cyanosis, no clubbing MUSCULOSKELETAL:  No deformity  SKIN: Warm and dry NEUROLOGIC:  Alert and oriented x 3, non-focal PSYCHIATRIC:  Normal affect, good insight  ASSESSMENT:    1. Essential hypertension   2. Paroxysmal atrial tachycardia ([place])   3. Symptomatic PVCs   4. Mixed hyperlipidemia   5. Atypical chest pain    PLAN:      His chest pain does sound atypical don't like musculoskeletal pain.  We'll continue to monitor this.  His blood pressure deceptively in the office no changes will be made to antihypertensive medication.  Tells me the palpitations has improved significantly.  Continue patient rosuvastatin 5 mg daily for his  hyperlipidemia.  The patient is in agreement with the above plan. The patient left the office in stable condition.  The patient will follow up in 6 months or sooner if needed.   Medication Adjustments/Labs and Tests Ordered: Current medicines are reviewed at length with the patient today.  Concerns regarding medicines are outlined above.  No orders of the defined types were placed in this encounter.  No orders of the defined types were placed in this encounter.   There are no Patient Instructions on file for this visit.   Adopting a Healthy Lifestyle.  Know what a healthy weight is for you (roughly BMI <25) and aim to maintain this   Aim for 7+ servings of fruits and vegetables daily   65-80+ fluid ounces of water or unsweet tea for healthy kidneys   Limit to max 1 drink of alcohol per day; avoid smoking/tobacco   Limit animal fats in diet for cholesterol and heart health - choose grass fed whenever available   Avoid highly processed foods, and foods high in saturated/trans fats   Aim for low stress - take time to unwind and care for your mental health   Aim for 150 min of moderate intensity exercise weekly for heart health, and weights twice weekly for bone health   Aim for 7-9 hours of sleep daily   When it comes to diets, agreement about the perfect plan isnt easy to find, even among the experts. Experts at the [employer] developed an idea known as the Healthy Eating Plate. Just imagine a plate divided into logical, healthy portions.   The emphasis is on diet quality:   Load up on vegetables and fruits - one-half of your plate: Aim for color and variety, and remember that potatoes dont count.   Go for whole grains - one-quarter of your plate: Whole wheat, barley, wheat berries, quinoa, oats, brown rice, and foods made with them. If you want pasta, go with whole wheat pasta.   Protein power - one-quarter of your plate: Fish, chicken, beans, and nuts  are all healthy, versatile protein sources. Limit red meat.   The diet, however, does go beyond the plate, offering a few other suggestions.   Use healthy plant oils, such as olive, canola, soy, corn, sunflower and peanut. Check the labels, and avoid partially hydrogenated oil, which have unhealthy trans fats.   If youre thirsty, drink water. Coffee and tea are good in moderation, but skip sugary drinks and limit milk and dairy products to one or two daily servings.   The type of carbohydrate in the diet is more important than the amount. Some sources of carbohydrates, such as vegetables, fruits, whole grains, and beans-are healthier than others.   Finally, stay active  Signed, Berniece Salines, DO  09/18/2019 1:44 PM    [facility]

## 2019-09-18 NOTE — Patient Instructions (Signed)

## 2019-10-22 DIAGNOSIS — N401 Enlarged prostate with lower urinary tract symptoms: Secondary | ICD-10-CM | POA: Diagnosis not present

## 2019-10-22 DIAGNOSIS — R310 Gross hematuria: Secondary | ICD-10-CM | POA: Diagnosis not present

## 2019-11-09 ENCOUNTER — Other Ambulatory Visit: Payer: Self-pay

## 2019-11-09 ENCOUNTER — Other Ambulatory Visit: Payer: Self-pay | Admitting: Interventional Radiology

## 2019-11-09 DIAGNOSIS — N2889 Other specified disorders of kidney and ureter: Secondary | ICD-10-CM

## 2019-11-12 DIAGNOSIS — G309 Alzheimer's disease, unspecified: Secondary | ICD-10-CM | POA: Diagnosis not present

## 2019-11-12 DIAGNOSIS — R42 Dizziness and giddiness: Secondary | ICD-10-CM | POA: Diagnosis not present

## 2019-11-12 DIAGNOSIS — Z6827 Body mass index (BMI) 27.0-27.9, adult: Secondary | ICD-10-CM | POA: Diagnosis not present

## 2019-11-12 DIAGNOSIS — Z79899 Other long term (current) drug therapy: Secondary | ICD-10-CM | POA: Diagnosis not present

## 2019-11-12 DIAGNOSIS — C642 Malignant neoplasm of left kidney, except renal pelvis: Secondary | ICD-10-CM | POA: Diagnosis not present

## 2019-11-12 DIAGNOSIS — H6123 Impacted cerumen, bilateral: Secondary | ICD-10-CM | POA: Diagnosis not present

## 2019-11-28 DIAGNOSIS — G309 Alzheimer's disease, unspecified: Secondary | ICD-10-CM | POA: Diagnosis not present

## 2019-11-28 DIAGNOSIS — Z79899 Other long term (current) drug therapy: Secondary | ICD-10-CM | POA: Diagnosis not present

## 2019-11-28 DIAGNOSIS — Z6826 Body mass index (BMI) 26.0-26.9, adult: Secondary | ICD-10-CM | POA: Diagnosis not present

## 2019-11-28 DIAGNOSIS — N400 Enlarged prostate without lower urinary tract symptoms: Secondary | ICD-10-CM | POA: Diagnosis not present

## 2019-11-28 DIAGNOSIS — E785 Hyperlipidemia, unspecified: Secondary | ICD-10-CM | POA: Diagnosis not present

## 2019-11-28 DIAGNOSIS — I10 Essential (primary) hypertension: Secondary | ICD-10-CM | POA: Diagnosis not present

## 2019-11-28 DIAGNOSIS — Z23 Encounter for immunization: Secondary | ICD-10-CM | POA: Diagnosis not present

## 2019-11-28 DIAGNOSIS — F0280 Dementia in other diseases classified elsewhere without behavioral disturbance: Secondary | ICD-10-CM | POA: Diagnosis not present

## 2019-12-03 DIAGNOSIS — Z9049 Acquired absence of other specified parts of digestive tract: Secondary | ICD-10-CM | POA: Diagnosis not present

## 2019-12-03 DIAGNOSIS — N2889 Other specified disorders of kidney and ureter: Secondary | ICD-10-CM | POA: Diagnosis not present

## 2019-12-03 DIAGNOSIS — C642 Malignant neoplasm of left kidney, except renal pelvis: Secondary | ICD-10-CM | POA: Diagnosis not present

## 2019-12-11 ENCOUNTER — Encounter: Payer: Self-pay | Admitting: *Deleted

## 2019-12-11 ENCOUNTER — Ambulatory Visit
Admission: RE | Admit: 2019-12-11 | Discharge: 2019-12-11 | Disposition: A | Discharge status: Home / Self Care | Payer: PPO | Source: Ambulatory Visit | Attending: Interventional Radiology | Admitting: Interventional Radiology

## 2019-12-11 ENCOUNTER — Other Ambulatory Visit: Payer: Self-pay

## 2019-12-11 DIAGNOSIS — N2889 Other specified disorders of kidney and ureter: Secondary | ICD-10-CM

## 2019-12-11 DIAGNOSIS — C642 Malignant neoplasm of left kidney, except renal pelvis: Secondary | ICD-10-CM | POA: Diagnosis not present

## 2019-12-11 DIAGNOSIS — Z9889 Other specified postprocedural states: Secondary | ICD-10-CM | POA: Diagnosis not present

## 2019-12-11 HISTORY — PX: IR RADIOLOGIST EVAL & MGMT: IMG5224

## 2019-12-11 NOTE — Progress Notes (Signed)
Chief Complaint: Patient was consulted remotely today (TeleHealth) for left renal cell carcinoma at the request of Nikiya Starn K.    Referring Physician(s): Dr. Joie Bimler  History of Present Illness: Javier Hall is a 84 y.o. male who initially presented at the kind request of Dr. Nila Nephew to discuss minimally invasion of the options for treatment of a solid left renal mass. Mr. Carchi was recently admitted with a urinary tract infection. Imaging demonstrated a small but enhancing solid mass arising from the interpolar left kidney. A follow-up MRI of the abdomen was performed confirming findings highly suspicious for a renal cell carcinoma. The mass measures 1.5 x 1.4 cm.    Mr. Witt underwent concomitant CT-guided biopsy and percutaneous thermal ablation of the renal lesion on 10/18/2018.  His postprocedural follow-up was uneventful and he recovered fully.  We are teleconferencing today to review his one year follow-up with surveillance MRI imaging.  Today, Mr. Pascucci is in his usual state of health.  He denies any flank pain, hematuria, abdominal pain, nausea, vomiting or other systemic symptoms.  He was very excited to hear the good news that his MRI demonstrated a complete response to therapy without evidence of residual, recurrent or new renal lesions.  Past Medical History:  Diagnosis Date  . Alzheimer's dementia ([place])   . Arrhythmia   . Arthritis   . Dementia ([place])    mild  . Essential hypertension 03/11/2015  . Familial hemochromatosis ([place])   . History of kidney stones   . Hyperlipidemia   . Hypertension   . Mild cognitive impairment 10/03/2012  . Near syncope 03/11/2015  . PONV (postoperative nausea and vomiting)   . Renal malignant tumor, left ([place]) 10/18/2018  . S/P total knee replacement 11/19/2015    Past Surgical History:  Procedure Laterality Date  . APPENDECTOMY    . BOWEL RESECTION    . CATARACT EXTRACTION Bilateral 08/2019  .  CHOLECYSTECTOMY    . COLONOSCOPY    . cyst removed from neck    . IR RADIOLOGIST EVAL & MGMT  11/14/2018  . IR RADIOLOGIST EVAL & MGMT  05/22/2019  . RADIOFREQUENCY ABLATION N/A 10/18/2018   Procedure: MICROWAVE THERMAL ABLATION AND BIOPSY;  Surgeon: Jacqulynn Cadet, MD;  Location: [facility];  Service: Anesthesiology;  Laterality: N/A;  . SKIN LESION EXCISION     multiple  . TOTAL KNEE ARTHROPLASTY Left 11/19/2015   Procedure: TOTAL KNEE ARTHROPLASTY;  Surgeon: Ninetta Lights, MD;  Location: [facility];  Service: Orthopedics;  Laterality: Left;    Allergies: Hydrocodone-acetaminophen  Medications: Prior to Admission medications   Medication Sig Start Date End Date Taking? Authorizing Provider  aspirin EC 81 MG tablet Take 81 mg by mouth daily.    [provider]  diphenhydrAMINE (BENADRYL) 25 mg capsule Take 25 mg by mouth every 6 (six) hours as needed.    [provider]  donepezil (ARICEPT) 10 MG tablet Take 10 mg by mouth daily.    [provider]  finasteride (PROSCAR) 5 MG tablet Take 5 mg by mouth daily.    [provider]  lisinopril (ZESTRIL) 20 MG tablet Take 20 mg by mouth daily.  09/21/15   [provider]  meclizine (ANTIVERT) 25 MG tablet Take 25 mg by mouth 3 (three) times daily as needed for dizziness.    [provider]  memantine (NAMENDA) 10 MG tablet Take 10 mg by mouth 2 (two) times daily.    [provider]  Multiple  Vitamin (MULTIVITAMIN WITH MINERALS) TABS tablet Take 1 tablet by mouth daily.    [provider]  rosuvastatin (CRESTOR) 5 MG tablet Take 5 mg by mouth once a week. 08/02/19   [provider]  tamsulosin (FLOMAX) 0.4 MG CAPS capsule Take 0.4 mg by mouth 2 (two) times daily.    [provider]     Family History  Problem Relation Age of Onset  . Breast cancer Mother   . Hypertension Father   . Diabetes Father   . Aneurysm Father   . Breast cancer Sister   . Alcohol  abuse Brother     Social History   Socioeconomic History  . Marital status: Married    Spouse name: Not on file  . Number of children: Not on file  . Years of education: Not on file  . Highest education level: Not on file  Occupational History  . Not on file  Tobacco Use  . Smoking status: Never Smoker  . Smokeless tobacco: Never Used  Vaping Use  . Vaping Use: Never used  Substance and Sexual Activity  . Alcohol use: No  . Drug use: No  . Sexual activity: Not on file  Other Topics Concern  . Not on file  Social History Narrative  . Not on file   Social Determinants of Health   Financial Resource Strain:   . Difficulty of Paying Living Expenses: Not on file  Food Insecurity:   . Worried About Charity fundraiser in the Last Year: Not on file  . Ran Out of Food in the Last Year: Not on file  Transportation Needs:   . Lack of Transportation (Medical): Not on file  . Lack of Transportation (Non-Medical): Not on file  Physical Activity:   . Days of Exercise per Week: Not on file  . Minutes of Exercise per Session: Not on file  Stress:   . Feeling of Stress : Not on file  Social Connections:   . Frequency of Communication with Friends and Family: Not on file  . Frequency of Social Gatherings with Friends and Family: Not on file  . Attends Religious Services: Not on file  . Active Member of Clubs or Organizations: Not on file  . Attends Archivist Meetings: Not on file  . Marital Status: Not on file    ECOG Status: 0 - Asymptomatic  Review of Systems  Review of Systems: A 12 point ROS discussed and pertinent positives are indicated in the HPI above.  All other systems are negative.  Physical Exam No direct physical exam was performed (except for noted visual exam findings with Video Visits).    Vital Signs: There were no vitals taken for this visit.  Imaging: No results found.  Labs:  CBC: No results for input(s): WBC, HGB, HCT, PLT in the  last 8760 hours.  COAGS: No results for input(s): INR, APTT in the last 8760 hours.  BMP: No results for input(s): NA, K, CL, CO2, GLUCOSE, BUN, CALCIUM, CREATININE, GFRNONAA, GFRAA in the last 8760 hours.  Invalid input(s): CMP  LIVER FUNCTION TESTS: No results for input(s): BILITOT, AST, ALT, ALKPHOS, PROT, ALBUMIN in the last 8760 hours.  TUMOR MARKERS: No results for input(s): AFPTM, CEA, CA199, CHROMGRNA in the last 8760 hours.  Assessment and Plan:  Mr. Lindahl is doing exceptionally well one year status post percutaneous thermal ablation of the left lower pole renal lesion.  His surveillance MRI performed on 12/03/19 demonstrates no evidence of  residual disease and no new liver lesions.  Mr. Oestreich was very excited to hear the good news.  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  1.)  Continue surveillance.  Repeat MRI with gadolinium contrast in 6 months with a follow-up clinic visit.   Electronically Signed: Antonietta Jewel Arcadia Gorgas 12/11/2019, 11:01 AM   I spent a total of  10 Minutes in remote  clinical consultation, greater than 50% of which was counseling/coordinating care for left renal cell carcinoma.    Visit type: Audio only (telephone). Audio (no video) only due to patient preference. Alternative for in-person consultation at [facility], [address] [place], [place], [place]. This visit type was conducted due to national recommendations for restrictions regarding the COVID-19 Pandemic (e.g. social distancing).  This format is felt to be most appropriate for this patient at this time.  All issues noted in this document were discussed and addressed.

## 2020-01-11 DIAGNOSIS — R42 Dizziness and giddiness: Secondary | ICD-10-CM | POA: Diagnosis not present

## 2020-01-11 DIAGNOSIS — Z20828 Contact with and (suspected) exposure to other viral communicable diseases: Secondary | ICD-10-CM | POA: Diagnosis not present

## 2020-01-11 DIAGNOSIS — R519 Headache, unspecified: Secondary | ICD-10-CM | POA: Diagnosis not present

## 2020-02-12 DIAGNOSIS — Z6827 Body mass index (BMI) 27.0-27.9, adult: Secondary | ICD-10-CM | POA: Diagnosis not present

## 2020-02-12 DIAGNOSIS — R42 Dizziness and giddiness: Secondary | ICD-10-CM | POA: Diagnosis not present

## 2020-02-12 DIAGNOSIS — I10 Essential (primary) hypertension: Secondary | ICD-10-CM | POA: Diagnosis not present

## 2020-03-11 DIAGNOSIS — F0280 Dementia in other diseases classified elsewhere without behavioral disturbance: Secondary | ICD-10-CM | POA: Diagnosis not present

## 2020-03-11 DIAGNOSIS — Z139 Encounter for screening, unspecified: Secondary | ICD-10-CM | POA: Diagnosis not present

## 2020-03-11 DIAGNOSIS — I10 Essential (primary) hypertension: Secondary | ICD-10-CM | POA: Diagnosis not present

## 2020-03-11 DIAGNOSIS — G309 Alzheimer's disease, unspecified: Secondary | ICD-10-CM | POA: Diagnosis not present

## 2020-03-11 DIAGNOSIS — L918 Other hypertrophic disorders of the skin: Secondary | ICD-10-CM | POA: Diagnosis not present

## 2020-03-11 DIAGNOSIS — Z79899 Other long term (current) drug therapy: Secondary | ICD-10-CM | POA: Diagnosis not present

## 2020-03-11 DIAGNOSIS — Z6827 Body mass index (BMI) 27.0-27.9, adult: Secondary | ICD-10-CM | POA: Diagnosis not present

## 2020-03-11 DIAGNOSIS — N400 Enlarged prostate without lower urinary tract symptoms: Secondary | ICD-10-CM | POA: Diagnosis not present

## 2020-03-11 DIAGNOSIS — E785 Hyperlipidemia, unspecified: Secondary | ICD-10-CM | POA: Diagnosis not present

## 2020-03-19 ENCOUNTER — Ambulatory Visit: Payer: PPO | Admitting: Cardiology

## 2020-03-30 DIAGNOSIS — L989 Disorder of the skin and subcutaneous tissue, unspecified: Secondary | ICD-10-CM | POA: Diagnosis not present

## 2020-03-30 DIAGNOSIS — M461 Sacroiliitis, not elsewhere classified: Secondary | ICD-10-CM | POA: Diagnosis not present

## 2020-04-02 DIAGNOSIS — D171 Benign lipomatous neoplasm of skin and subcutaneous tissue of trunk: Secondary | ICD-10-CM | POA: Diagnosis not present

## 2020-04-02 DIAGNOSIS — R229 Localized swelling, mass and lump, unspecified: Secondary | ICD-10-CM

## 2020-04-02 HISTORY — DX: Localized swelling, mass and lump, unspecified: R22.9

## 2020-04-09 DIAGNOSIS — Z87442 Personal history of urinary calculi: Secondary | ICD-10-CM | POA: Insufficient documentation

## 2020-04-09 DIAGNOSIS — E785 Hyperlipidemia, unspecified: Secondary | ICD-10-CM | POA: Insufficient documentation

## 2020-04-09 DIAGNOSIS — F0390 Unspecified dementia without behavioral disturbance: Secondary | ICD-10-CM | POA: Insufficient documentation

## 2020-04-09 DIAGNOSIS — G309 Alzheimer's disease, unspecified: Secondary | ICD-10-CM | POA: Insufficient documentation

## 2020-04-09 DIAGNOSIS — F0280 Dementia in other diseases classified elsewhere without behavioral disturbance: Secondary | ICD-10-CM | POA: Insufficient documentation

## 2020-04-09 DIAGNOSIS — Z9889 Other specified postprocedural states: Secondary | ICD-10-CM | POA: Insufficient documentation

## 2020-04-09 DIAGNOSIS — R112 Nausea with vomiting, unspecified: Secondary | ICD-10-CM | POA: Insufficient documentation

## 2020-04-09 DIAGNOSIS — M1990 Unspecified osteoarthritis, unspecified site: Secondary | ICD-10-CM | POA: Insufficient documentation

## 2020-04-09 DIAGNOSIS — I10 Essential (primary) hypertension: Secondary | ICD-10-CM | POA: Insufficient documentation

## 2020-04-09 DIAGNOSIS — I499 Cardiac arrhythmia, unspecified: Secondary | ICD-10-CM | POA: Insufficient documentation

## 2020-04-11 ENCOUNTER — Ambulatory Visit: Payer: PPO | Admitting: Cardiology

## 2020-04-11 ENCOUNTER — Encounter: Payer: Self-pay | Admitting: Cardiology

## 2020-04-11 ENCOUNTER — Other Ambulatory Visit: Payer: Self-pay

## 2020-04-11 VITALS — BP 112/62 | HR 60 | Ht 71.0 in | Wt 190.0 lb

## 2020-04-11 DIAGNOSIS — I493 Ventricular premature depolarization: Secondary | ICD-10-CM | POA: Diagnosis not present

## 2020-04-11 DIAGNOSIS — I471 Supraventricular tachycardia: Secondary | ICD-10-CM | POA: Diagnosis not present

## 2020-04-11 DIAGNOSIS — I10 Essential (primary) hypertension: Secondary | ICD-10-CM

## 2020-04-11 NOTE — Progress Notes (Signed)
Cardiology Office Note:    Date:  04/11/2020   ID:  Javier Hall, DOB 10/27/1935, MRN [id number]  PCP:  Helen Hashimoto., MD  Cardiologist:  Berniece Salines, DO  Electrophysiologist:  None   Referring MD: Helen Hashimoto., MD   I am doing fine  History of Present Illness:    Javier Hall is a 85 y.o. male with a hx of hypertension, hyperlipidemia PVC presentedfor a follow up visit.  The patient is here today for follow-up visit I last saw him on September 18, 2019 at that time he appeared to be doing well from a cardiovascular standpoint.  He did have some musculoskeletal pain which we will going to monitor.  Today he does not have any pain.  He recently had a surgical procedure removing a lipoma.  No other complaints at this time.  Past Medical History:  Diagnosis Date  . Alzheimer's dementia ([place])   . Arrhythmia   . Arthritis   . Dementia ([place])    mild  . Essential hypertension 03/11/2015  . Familial hemochromatosis ([place])   . History of kidney stones   . Hyperlipidemia   . Hypertension   . Mild cognitive impairment 10/03/2012  . Near syncope 03/11/2015  . PONV (postoperative nausea and vomiting)   . Renal malignant tumor, left ([place]) 10/18/2018  . S/P total knee replacement 11/19/2015    Past Surgical History:  Procedure Laterality Date  . APPENDECTOMY    . BOWEL RESECTION    . CATARACT EXTRACTION Bilateral 08/2019  . CHOLECYSTECTOMY    . COLONOSCOPY    . cyst removed from neck    . IR RADIOLOGIST EVAL & MGMT  11/14/2018  . IR RADIOLOGIST EVAL & MGMT  05/22/2019  . IR RADIOLOGIST EVAL & MGMT  12/11/2019  . RADIOFREQUENCY ABLATION N/A 10/18/2018   Procedure: MICROWAVE THERMAL ABLATION AND BIOPSY;  Surgeon: Jacqulynn Cadet, MD;  Location: [facility];  Service: Anesthesiology;  Laterality: N/A;  . SKIN LESION EXCISION     multiple  . TOTAL KNEE ARTHROPLASTY Left 11/19/2015   Procedure: TOTAL KNEE ARTHROPLASTY;  Surgeon: Ninetta Lights, MD;  Location:  [facility];  Service: Orthopedics;  Laterality: Left;    Current Medications: Current Meds  Medication Sig  . aspirin EC 81 MG tablet Take 81 mg by mouth daily.  . diphenhydrAMINE (BENADRYL) 25 mg capsule Take 25 mg by mouth every 6 (six) hours as needed for itching, sleep or allergies.  Marland Kitchen donepezil (ARICEPT) 10 MG tablet Take 10 mg by mouth daily.  . finasteride (PROSCAR) 5 MG tablet Take 5 mg by mouth daily.  Marland Kitchen lisinopril (ZESTRIL) 20 MG tablet Take 20 mg by mouth daily.   . meclizine (ANTIVERT) 25 MG tablet Take 25 mg by mouth 3 (three) times daily as needed for dizziness.  . memantine (NAMENDA) 10 MG tablet Take 10 mg by mouth 2 (two) times daily.  . Multiple Vitamin (MULTIVITAMIN WITH MINERALS) TABS tablet Take 1 tablet by mouth daily.  . rosuvastatin (CRESTOR) 5 MG tablet Take 5 mg by mouth once a week.  . tamsulosin (FLOMAX) 0.4 MG CAPS capsule Take 0.4 mg by mouth 2 (two) times daily.     Allergies:   Hydrocodone-acetaminophen   Social History   Socioeconomic History  . Marital status: Married    Spouse name: Not on file  . Number of children: Not on file  . Years of education: Not on file  . Highest education level: Not on file  Occupational History  . Not on file  Tobacco Use  . Smoking status: Never Smoker  . Smokeless tobacco: Never Used  Vaping Use  . Vaping Use: Never used  Substance and Sexual Activity  . Alcohol use: No  . Drug use: No  . Sexual activity: Not on file  Other Topics Concern  . Not on file  Social History Narrative  . Not on file   Social Determinants of Health   Financial Resource Strain: Not on file  Food Insecurity: Not on file  Transportation Needs: Not on file  Physical Activity: Not on file  Stress: Not on file  Social Connections: Not on file     Family History: The patient's family history includes Alcohol abuse in his brother; Aneurysm in his father; Breast cancer in his mother and sister; Diabetes in his father; Hypertension in  his father.  ROS:   Review of Systems  Constitution: Negative for decreased appetite, fever and weight gain.  HENT: Negative for congestion, ear discharge, hoarse voice and sore throat.   Eyes: Negative for discharge, redness, vision loss in right eye and visual halos.  Cardiovascular: Negative for chest pain, dyspnea on exertion, leg swelling, orthopnea and palpitations.  Respiratory: Negative for cough, hemoptysis, shortness of breath and snoring.   Endocrine: Negative for heat intolerance and polyphagia.  Hematologic/Lymphatic: Negative for bleeding problem. Does not bruise/bleed easily.  Skin: Negative for flushing, nail changes, rash and suspicious lesions.  Musculoskeletal: Negative for arthritis, joint pain, muscle cramps, myalgias, neck pain and stiffness.  Gastrointestinal: Negative for abdominal pain, bowel incontinence, diarrhea and excessive appetite.  Genitourinary: Negative for decreased libido, genital sores and incomplete emptying.  Neurological: Negative for brief paralysis, focal weakness, headaches and loss of balance.  Psychiatric/Behavioral: Negative for altered mental status, depression and suicidal ideas.  Allergic/Immunologic: Negative for HIV exposure and persistent infections.    EKGs/Labs/Other Studies Reviewed:    The following studies were reviewed today:   EKG: None today  Echo IMPRESSIONS November 2020 1. Left ventricular ejection fraction, by visual estimation, is 60 to  65%. The left ventricle has normal function. Left ventricular septal wall thickness was mildly increased. Mildly increased left ventricular posterior wall thickness. There is mildly increased left ventricular hypertrophy.  2. Left ventricular diastolic parameters are consistent with Grade II diastolic dysfunction (pseudonormalization).  3. Global right ventricle has normal systolic function.The right ventricular size is normal. No increase in right ventricular wall thickness.  4.  Left atrial size was normal.  5. Right atrial size was normal.  6. The mitral valve is normal in structure. No evidence of mitral valve regurgitation. No evidence of mitral stenosis.  7. The tricuspid valve is normal in structure. Tricuspid valve regurgitation is not demonstrated.  8. The aortic valve is normal in structure. Aortic valve regurgitation is not visualized. No evidence of aortic valve sclerosis or stenosis.  9. The pulmonic valve was not well visualized. Pulmonic valve regurgitation is not visualized.  10. There is mild dilatation of the ascending aorta measuring 37 mm.  11. Normal pulmonary artery systolic pressure.   Zio monitor The patient wore the monitor for 6 days and 20 hours. Indication: Premature ventricular complexes The minimum heart rate was 44 bpm,maximum heart rate 174 bpm, and average heart rate was 67 bpm. The predominant underlying rhythm was Sinus Rhythm. First Degree AV Block was present.  18 Supraventricular Tachycardia runs occurred, the run with the fastest interval lasting 8 beats with a maximum heat rate of 174  bpm, the longest lasting 16 beats with an avg rate of 149 bpm.  Premature atrial complexes were rare (<1.0%). Premature ventricular complexes were rare (<1.0%). Ventricular Bigeminy and Trigeminy were present. No ventricular tachycardia, No second or third degree AV block, No pause and no atrial fibrillation. Conclusion: This study is remarkable for SVT which is likely atrial tachycardia with variable block and symptomatic ventricular ectopy. Please clinically correlate.  Recent Labs: No results found for requested labs within last 8760 hours.  Recent Lipid Panel No results found for: CHOL, TRIG, HDL, CHOLHDL, VLDL, LDLCALC, LDLDIRECT  Physical Exam:    VS:  BP 112/62   Pulse 60   Ht 5\' 11"  (1.803 m)   Wt 190 lb (86.2 kg)   SpO2 97%   BMI 26.50 kg/m     Wt Readings from Last 3 Encounters:  04/11/20 190 lb (86.2 kg)  09/18/19  185 lb (83.9 kg)  03/05/19 192 lb (87.1 kg)     GEN: Well nourished, well developed in no acute distress HEENT: Normal NECK: No JVD; No carotid bruits LYMPHATICS: No lymphadenopathy CARDIAC: S1S2 noted,RRR, no murmurs, rubs, gallops RESPIRATORY:  Clear to auscultation without rales, wheezing or rhonchi  ABDOMEN: Soft, non-tender, non-distended, +bowel sounds, no guarding. EXTREMITIES: No edema, No cyanosis, no clubbing MUSCULOSKELETAL:  No deformity  SKIN: Warm and dry NEUROLOGIC:  Alert and oriented x 3, non-focal PSYCHIATRIC:  Normal affect, good insight  ASSESSMENT:    1. Hypertension, unspecified type   2. Paroxysmal atrial tachycardia ([place])   3. Symptomatic PVCs    PLAN:     He is doing well from a cardiovascular standpoint.  His only concern that he and his daughter have a sometimes his heart rate has dropped to the 40s but he has no symptoms when this happened.  I discussed with the patient and his daughter his most recent monitor which was done in November 2020 which showed no concern for chronotropic incompetence.  His heart rate dropped down to the 40s and his maximum heart rate was 174 bpm.  The patient is in agreement with the above plan. The patient left the office in stable condition.  The patient will follow up in   Medication Adjustments/Labs and Tests Ordered: Current medicines are reviewed at length with the patient today.  Concerns regarding medicines are outlined above.  No orders of the defined types were placed in this encounter.  No orders of the defined types were placed in this encounter.   Patient Instructions  Medication Instructions:  Your physician recommends that you continue on your current medications as directed. Please refer to the Current Medication list given to you today.  *If you need a refill on your cardiac medications before your next appointment, please call your pharmacy*   Lab Work: None If you have labs (blood work) drawn  today and your tests are completely normal, you will receive your results only by: Marland Kitchen MyChart Message (if you have MyChart) OR . A paper copy in the mail If you have any lab test that is abnormal or we need to change your treatment, we will call you to review the results.   Testing/Procedures: None   Follow-Up: At [facility], you and your health needs are our priority.  As part of our continuing mission to provide you with exceptional heart care, we have created designated Provider Care Teams.  These Care Teams include your primary Cardiologist (physician) and Advanced Practice Providers (APPs -  Physician Assistants and Nurse Practitioners)  who all work together to provide you with the care you need, when you need it.  We recommend signing up for the patient portal called "MyChart".  Sign up information is provided on this After Visit Summary.  MyChart is used to connect with patients for Virtual Visits (Telemedicine).  Patients are able to view lab/test results, encounter notes, upcoming appointments, etc.  Non-urgent messages can be sent to your provider as well.   To learn more about what you can do with MyChart, go to [email or web address].    Your next appointment:   6 month(s)  The format for your next appointment:   In Person  Provider:   Berniece Salines, DO   Other Instructions      Adopting a Healthy Lifestyle.  Know what a healthy weight is for you (roughly BMI <25) and aim to maintain this   Aim for 7+ servings of fruits and vegetables daily   65-80+ fluid ounces of water or unsweet tea for healthy kidneys   Limit to max 1 drink of alcohol per day; avoid smoking/tobacco   Limit animal fats in diet for cholesterol and heart health - choose grass fed whenever available   Avoid highly processed foods, and foods high in saturated/trans fats   Aim for low stress - take time to unwind and care for your mental health   Aim for 150 min of moderate intensity  exercise weekly for heart health, and weights twice weekly for bone health   Aim for 7-9 hours of sleep daily   When it comes to diets, agreement about the perfect plan isnt easy to find, even among the experts. Experts at the [place] developed an idea known as the Healthy Eating Plate. Just imagine a plate divided into logical, healthy portions.   The emphasis is on diet quality:   Load up on vegetables and fruits - one-half of your plate: Aim for color and variety, and remember that potatoes dont count.   Go for whole grains - one-quarter of your plate: Whole wheat, barley, wheat berries, quinoa, oats, brown rice, and foods made with them. If you want pasta, go with whole wheat pasta.   Protein power - one-quarter of your plate: Fish, chicken, beans, and nuts are all healthy, versatile protein sources. Limit red meat.   The diet, however, does go beyond the plate, offering a few other suggestions.   Use healthy plant oils, such as olive, canola, soy, corn, sunflower and peanut. Check the labels, and avoid partially hydrogenated oil, which have unhealthy trans fats.   If youre thirsty, drink water. Coffee and tea are good in moderation, but skip sugary drinks and limit milk and dairy products to one or two daily servings.   The type of carbohydrate in the diet is more important than the amount. Some sources of carbohydrates, such as vegetables, fruits, whole grains, and beans-are healthier than others.   Finally, stay active  Signed, Berniece Salines, DO  04/11/2020 3:12 PM    [facility]

## 2020-04-11 NOTE — Patient Instructions (Signed)

## 2020-04-13 DIAGNOSIS — I10 Essential (primary) hypertension: Secondary | ICD-10-CM | POA: Diagnosis not present

## 2020-04-13 DIAGNOSIS — R262 Difficulty in walking, not elsewhere classified: Secondary | ICD-10-CM | POA: Diagnosis not present

## 2020-04-13 DIAGNOSIS — Z79899 Other long term (current) drug therapy: Secondary | ICD-10-CM | POA: Diagnosis not present

## 2020-04-13 DIAGNOSIS — Z87891 Personal history of nicotine dependence: Secondary | ICD-10-CM | POA: Diagnosis not present

## 2020-04-13 DIAGNOSIS — H8110 Benign paroxysmal vertigo, unspecified ear: Secondary | ICD-10-CM | POA: Diagnosis not present

## 2020-04-13 DIAGNOSIS — Z7982 Long term (current) use of aspirin: Secondary | ICD-10-CM | POA: Diagnosis not present

## 2020-04-13 DIAGNOSIS — R42 Dizziness and giddiness: Secondary | ICD-10-CM | POA: Diagnosis not present

## 2020-04-13 DIAGNOSIS — R202 Paresthesia of skin: Secondary | ICD-10-CM | POA: Diagnosis not present

## 2020-04-21 DIAGNOSIS — N2889 Other specified disorders of kidney and ureter: Secondary | ICD-10-CM | POA: Diagnosis not present

## 2020-04-21 DIAGNOSIS — N401 Enlarged prostate with lower urinary tract symptoms: Secondary | ICD-10-CM | POA: Diagnosis not present

## 2020-05-12 ENCOUNTER — Other Ambulatory Visit: Payer: Self-pay | Admitting: Interventional Radiology

## 2020-05-12 ENCOUNTER — Other Ambulatory Visit: Payer: Self-pay

## 2020-05-12 DIAGNOSIS — N2889 Other specified disorders of kidney and ureter: Secondary | ICD-10-CM

## 2020-05-30 DIAGNOSIS — K760 Fatty (change of) liver, not elsewhere classified: Secondary | ICD-10-CM | POA: Diagnosis not present

## 2020-05-30 DIAGNOSIS — C642 Malignant neoplasm of left kidney, except renal pelvis: Secondary | ICD-10-CM | POA: Diagnosis not present

## 2020-05-30 DIAGNOSIS — N2889 Other specified disorders of kidney and ureter: Secondary | ICD-10-CM | POA: Diagnosis not present

## 2020-05-30 DIAGNOSIS — Z9049 Acquired absence of other specified parts of digestive tract: Secondary | ICD-10-CM | POA: Diagnosis not present

## 2020-06-03 ENCOUNTER — Encounter: Payer: Self-pay | Admitting: *Deleted

## 2020-06-03 ENCOUNTER — Ambulatory Visit
Admission: RE | Admit: 2020-06-03 | Discharge: 2020-06-03 | Disposition: A | Discharge status: Home / Self Care | Payer: PPO | Source: Ambulatory Visit | Attending: Interventional Radiology | Admitting: Interventional Radiology

## 2020-06-03 ENCOUNTER — Other Ambulatory Visit: Payer: Self-pay

## 2020-06-03 DIAGNOSIS — N2889 Other specified disorders of kidney and ureter: Secondary | ICD-10-CM

## 2020-06-03 HISTORY — PX: IR RADIOLOGIST EVAL & MGMT: IMG5224

## 2020-06-03 NOTE — Progress Notes (Signed)
Chief Complaint: Patient was consulted remotely today (TeleHealth) for renal neoplasm at the request of Javier Zapanta K.    Referring Physician(s): Javier Hall  History of Present Illness: Javier Hall is a 85 y.o. male whoinitially presentedat the kind request of Javier Hall to discuss minimally invasion of the options for treatment of a solid left renal mass. Javier Hall was recently admitted with a urinary tract infection. Imaging demonstrated a small but enhancing solid mass arising from the interpolar left kidney. A follow-up MRI of the abdomen was performed confirming findings highly suspicious for a renal cell carcinoma. The mass measures 1.5 x 1.4 cm.  Mr. Vanblarcom underwent concomitant CT-guided biopsy and percutaneous thermal ablation of the renal lesion on 10/18/2018. His postprocedural follow-up was uneventful and he recovered fully. We are teleconferencing today to review his 1.5 year follow-up with surveillance MRI imaging.  Today, Javier Hall is in his usual state of health. He denies any flank pain, hematuria, abdominal pain, nausea, vomiting or other systemic symptoms. He was very excited to hear the good news that his MRI demonstrated a complete response to therapy without evidence of residual, recurrent or new renal lesions.  Past Medical History:  Diagnosis Date  . Alzheimer's dementia (Javier Hall)   . Arrhythmia   . Arthritis   . Dementia (Javier Hall)    mild  . Essential hypertension 03/11/2015  . Familial hemochromatosis (Javier Hall)   . History of kidney stones   . Hyperlipidemia   . Hypertension   . Mild cognitive impairment 10/03/2012  . Near syncope 03/11/2015  . PONV (postoperative nausea and vomiting)   . Renal malignant tumor, left (Javier Hall) 10/18/2018  . S/P total knee replacement 11/19/2015    Past Surgical History:  Procedure Laterality Date  . APPENDECTOMY    . BOWEL RESECTION    . CATARACT EXTRACTION Bilateral 08/2019  . CHOLECYSTECTOMY    .  COLONOSCOPY    . cyst removed from neck    . IR RADIOLOGIST EVAL & MGMT  11/14/2018  . IR RADIOLOGIST EVAL & MGMT  05/22/2019  . IR RADIOLOGIST EVAL & MGMT  12/11/2019  . RADIOFREQUENCY ABLATION N/A 10/18/2018   Procedure: MICROWAVE THERMAL ABLATION AND BIOPSY;  Surgeon: Jacqulynn Cadet, MD;  Location: [facility];  Service: Anesthesiology;  Laterality: N/A;  . SKIN LESION EXCISION     multiple  . TOTAL KNEE ARTHROPLASTY Left 11/19/2015   Procedure: TOTAL KNEE ARTHROPLASTY;  Surgeon: Ninetta Lights, MD;  Location: [facility];  Service: Orthopedics;  Laterality: Left;    Allergies: Hydrocodone-acetaminophen  Medications: Prior to Admission medications   Medication Sig Start Date End Date Taking? Authorizing Provider  aspirin EC 81 MG tablet Take 81 mg by mouth daily.    [provider]  diphenhydrAMINE (BENADRYL) 25 mg capsule Take 25 mg by mouth every 6 (six) hours as needed for itching, sleep or allergies.    [provider]  donepezil (ARICEPT) 10 MG tablet Take 10 mg by mouth daily.    [provider]  finasteride (PROSCAR) 5 MG tablet Take 5 mg by mouth daily.    [provider]  lisinopril (ZESTRIL) 20 MG tablet Take 20 mg by mouth daily.  09/21/15   [provider]  meclizine (ANTIVERT) 25 MG tablet Take 25 mg by mouth 3 (three) times daily as needed for dizziness.    [provider]  memantine (NAMENDA) 10 MG tablet Take 10 mg by mouth 2 (two) times daily.    [provider]  Multiple Vitamin (MULTIVITAMIN WITH MINERALS) TABS tablet Take 1 tablet by mouth daily.    [provider]  rosuvastatin (CRESTOR) 5 MG tablet Take 5 mg by mouth once a week. 08/02/19   [provider]  tamsulosin (FLOMAX) 0.4 MG CAPS capsule Take 0.4 mg by mouth 2 (two) times daily.    [provider]     Family History  Problem Relation Age of Onset  . Breast cancer Mother   . Hypertension Father   . Diabetes Father   .  Aneurysm Father   . Breast cancer Sister   . Alcohol abuse Brother     Social History   Socioeconomic History  . Marital status: Married    Spouse name: Not on file  . Number of children: Not on file  . Years of education: Not on file  . Highest education level: Not on file  Occupational History  . Not on file  Tobacco Use  . Smoking status: Never Smoker  . Smokeless tobacco: Never Used  Vaping Use  . Vaping Use: Never used  Substance and Sexual Activity  . Alcohol use: No  . Drug use: No  . Sexual activity: Not on file  Other Topics Concern  . Not on file  Social History Narrative  . Not on file   Social Determinants of Health   Financial Resource Strain: Not on file  Food Insecurity: Not on file  Transportation Needs: Not on file  Physical Activity: Not on file  Stress: Not on file  Social Connections: Not on file    ECOG Status: 0 - Asymptomatic  Review of Systems  Review of Systems: A 12 point ROS discussed and pertinent positives are indicated in the HPI above.  All other systems are negative.  Physical Exam No direct physical exam was performed (except for noted visual exam findings with Video Visits).   Vital Signs: There were no vitals taken for this visit.  Imaging: No results found.  Labs:  CBC: No results for input(s): WBC, HGB, HCT, PLT in the last 8760 hours.  COAGS: No results for input(s): INR, APTT in the last 8760 hours.  BMP: No results for input(s): NA, K, CL, CO2, GLUCOSE, BUN, CALCIUM, CREATININE, GFRNONAA, GFRAA in the last 8760 hours.  Invalid input(s): CMP  LIVER FUNCTION TESTS: No results for input(s): BILITOT, AST, ALT, ALKPHOS, PROT, ALBUMIN in the last 8760 hours.  TUMOR MARKERS: No results for input(s): AFPTM, CEA, CA199, CHROMGRNA in the last 8760 hours.  Assessment and Plan:  Mr. 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   Surveillance MRI imaging demonstrates complete response to therapy with no evidence of residual, recurrent or new disease.  We will continue surveillance.  At this point, annual surveillance can be pursued.  1.)  Follow-up MRI with gadolinium contrast and accompanying clinic visit in 1 year.   Electronically Signed: Criselda Peaches 06/03/2020, 3:55 PM   I spent a total of 15 Minutes in remote  clinical consultation, greater than 50% of which was counseling/coordinating care for renal neoplasm.    Visit type: Audio only (telephone). Audio (no video) only due to patient preference. Alternative for in-person consultation at [facility], [address] [place], [place], [place]. This visit type was conducted due to national recommendations for restrictions regarding the COVID-19 Pandemic (e.g. social distancing).  This format is felt to be most appropriate for this patient at this  time.  All issues noted in this document were discussed and addressed.

## 2020-07-15 DIAGNOSIS — Z6828 Body mass index (BMI) 28.0-28.9, adult: Secondary | ICD-10-CM | POA: Diagnosis not present

## 2020-07-15 DIAGNOSIS — N400 Enlarged prostate without lower urinary tract symptoms: Secondary | ICD-10-CM | POA: Diagnosis not present

## 2020-07-15 DIAGNOSIS — G309 Alzheimer's disease, unspecified: Secondary | ICD-10-CM | POA: Diagnosis not present

## 2020-07-15 DIAGNOSIS — F0280 Dementia in other diseases classified elsewhere without behavioral disturbance: Secondary | ICD-10-CM | POA: Diagnosis not present

## 2020-07-15 DIAGNOSIS — E785 Hyperlipidemia, unspecified: Secondary | ICD-10-CM | POA: Diagnosis not present

## 2020-07-15 DIAGNOSIS — I10 Essential (primary) hypertension: Secondary | ICD-10-CM | POA: Diagnosis not present

## 2020-07-15 DIAGNOSIS — Z79899 Other long term (current) drug therapy: Secondary | ICD-10-CM | POA: Diagnosis not present

## 2020-08-12 DIAGNOSIS — M549 Dorsalgia, unspecified: Secondary | ICD-10-CM | POA: Diagnosis not present

## 2020-08-12 DIAGNOSIS — Z6828 Body mass index (BMI) 28.0-28.9, adult: Secondary | ICD-10-CM | POA: Diagnosis not present

## 2020-08-12 DIAGNOSIS — Z79899 Other long term (current) drug therapy: Secondary | ICD-10-CM | POA: Diagnosis not present

## 2020-09-16 DIAGNOSIS — Z79899 Other long term (current) drug therapy: Secondary | ICD-10-CM | POA: Diagnosis not present

## 2020-09-16 DIAGNOSIS — Z6827 Body mass index (BMI) 27.0-27.9, adult: Secondary | ICD-10-CM | POA: Diagnosis not present

## 2020-09-16 DIAGNOSIS — R55 Syncope and collapse: Secondary | ICD-10-CM | POA: Diagnosis not present

## 2020-09-18 ENCOUNTER — Other Ambulatory Visit: Payer: Self-pay

## 2020-09-18 DIAGNOSIS — N400 Enlarged prostate without lower urinary tract symptoms: Secondary | ICD-10-CM | POA: Insufficient documentation

## 2020-09-19 DIAGNOSIS — R7989 Other specified abnormal findings of blood chemistry: Secondary | ICD-10-CM | POA: Diagnosis not present

## 2020-09-19 DIAGNOSIS — R531 Weakness: Secondary | ICD-10-CM | POA: Diagnosis not present

## 2020-09-19 DIAGNOSIS — R197 Diarrhea, unspecified: Secondary | ICD-10-CM | POA: Diagnosis not present

## 2020-09-19 DIAGNOSIS — R519 Headache, unspecified: Secondary | ICD-10-CM | POA: Diagnosis not present

## 2020-09-19 DIAGNOSIS — K5792 Diverticulitis of intestine, part unspecified, without perforation or abscess without bleeding: Secondary | ICD-10-CM | POA: Diagnosis not present

## 2020-09-19 DIAGNOSIS — Z85528 Personal history of other malignant neoplasm of kidney: Secondary | ICD-10-CM | POA: Diagnosis not present

## 2020-09-19 DIAGNOSIS — R059 Cough, unspecified: Secondary | ICD-10-CM | POA: Diagnosis not present

## 2020-09-19 LAB — PROTIME-INR: INR: 1 (ref 0.9–1.1)

## 2020-09-20 DIAGNOSIS — R531 Weakness: Secondary | ICD-10-CM | POA: Diagnosis not present

## 2020-09-20 DIAGNOSIS — Z87891 Personal history of nicotine dependence: Secondary | ICD-10-CM | POA: Diagnosis not present

## 2020-09-20 DIAGNOSIS — R197 Diarrhea, unspecified: Secondary | ICD-10-CM | POA: Diagnosis not present

## 2020-09-20 DIAGNOSIS — K5792 Diverticulitis of intestine, part unspecified, without perforation or abscess without bleeding: Secondary | ICD-10-CM | POA: Diagnosis not present

## 2020-09-20 DIAGNOSIS — R2681 Unsteadiness on feet: Secondary | ICD-10-CM | POA: Diagnosis not present

## 2020-09-20 DIAGNOSIS — R519 Headache, unspecified: Secondary | ICD-10-CM | POA: Diagnosis not present

## 2020-09-20 DIAGNOSIS — I10 Essential (primary) hypertension: Secondary | ICD-10-CM | POA: Diagnosis not present

## 2020-09-20 DIAGNOSIS — N400 Enlarged prostate without lower urinary tract symptoms: Secondary | ICD-10-CM | POA: Diagnosis not present

## 2020-09-20 DIAGNOSIS — M19042 Primary osteoarthritis, left hand: Secondary | ICD-10-CM | POA: Diagnosis not present

## 2020-09-20 DIAGNOSIS — M19041 Primary osteoarthritis, right hand: Secondary | ICD-10-CM | POA: Diagnosis not present

## 2020-09-20 DIAGNOSIS — K5732 Diverticulitis of large intestine without perforation or abscess without bleeding: Secondary | ICD-10-CM | POA: Diagnosis not present

## 2020-09-20 DIAGNOSIS — R059 Cough, unspecified: Secondary | ICD-10-CM | POA: Diagnosis not present

## 2020-09-20 DIAGNOSIS — F0390 Unspecified dementia without behavioral disturbance: Secondary | ICD-10-CM | POA: Diagnosis not present

## 2020-09-20 DIAGNOSIS — R7401 Elevation of levels of liver transaminase levels: Secondary | ICD-10-CM | POA: Diagnosis not present

## 2020-09-20 DIAGNOSIS — E860 Dehydration: Secondary | ICD-10-CM | POA: Diagnosis not present

## 2020-09-20 DIAGNOSIS — Z85528 Personal history of other malignant neoplasm of kidney: Secondary | ICD-10-CM | POA: Diagnosis not present

## 2020-09-20 DIAGNOSIS — E785 Hyperlipidemia, unspecified: Secondary | ICD-10-CM | POA: Diagnosis not present

## 2020-09-20 DIAGNOSIS — R42 Dizziness and giddiness: Secondary | ICD-10-CM | POA: Diagnosis not present

## 2020-09-20 DIAGNOSIS — Z79899 Other long term (current) drug therapy: Secondary | ICD-10-CM | POA: Diagnosis not present

## 2020-09-20 DIAGNOSIS — Z7982 Long term (current) use of aspirin: Secondary | ICD-10-CM | POA: Diagnosis not present

## 2020-09-20 DIAGNOSIS — R7989 Other specified abnormal findings of blood chemistry: Secondary | ICD-10-CM | POA: Diagnosis not present

## 2020-09-22 ENCOUNTER — Ambulatory Visit: Payer: PPO | Admitting: Cardiology

## 2020-10-02 DIAGNOSIS — Z09 Encounter for follow-up examination after completed treatment for conditions other than malignant neoplasm: Secondary | ICD-10-CM | POA: Diagnosis not present

## 2020-10-02 DIAGNOSIS — Z6827 Body mass index (BMI) 27.0-27.9, adult: Secondary | ICD-10-CM | POA: Diagnosis not present

## 2020-10-02 DIAGNOSIS — K5792 Diverticulitis of intestine, part unspecified, without perforation or abscess without bleeding: Secondary | ICD-10-CM | POA: Diagnosis not present

## 2020-10-02 DIAGNOSIS — Z79899 Other long term (current) drug therapy: Secondary | ICD-10-CM | POA: Diagnosis not present

## 2020-10-22 DIAGNOSIS — N2889 Other specified disorders of kidney and ureter: Secondary | ICD-10-CM | POA: Diagnosis not present

## 2020-10-22 DIAGNOSIS — N401 Enlarged prostate with lower urinary tract symptoms: Secondary | ICD-10-CM | POA: Diagnosis not present

## 2020-11-06 ENCOUNTER — Ambulatory Visit: Payer: PPO | Admitting: Cardiology

## 2020-11-06 ENCOUNTER — Encounter: Payer: Self-pay | Admitting: Cardiology

## 2020-11-06 ENCOUNTER — Other Ambulatory Visit: Payer: Self-pay

## 2020-11-06 VITALS — BP 138/74 | HR 63 | Ht 71.0 in | Wt 189.6 lb

## 2020-11-06 DIAGNOSIS — I10 Essential (primary) hypertension: Secondary | ICD-10-CM | POA: Diagnosis not present

## 2020-11-06 DIAGNOSIS — I471 Supraventricular tachycardia: Secondary | ICD-10-CM | POA: Diagnosis not present

## 2020-11-06 DIAGNOSIS — E782 Mixed hyperlipidemia: Secondary | ICD-10-CM

## 2020-11-06 DIAGNOSIS — G3184 Mild cognitive impairment, so stated: Secondary | ICD-10-CM | POA: Diagnosis not present

## 2020-11-06 NOTE — Progress Notes (Signed)
Cardiology Office Note:    Date:  11/06/2020   ID:  Javier Hall, DOB 11/30/1935, MRN [id number]  PCP:  Helen Hashimoto., MD  Cardiologist:  Jenean Lindau, MD   Referring MD: Helen Hashimoto., MD    ASSESSMENT:    1. Essential hypertension   2. Paroxysmal atrial tachycardia ([place])   3. Mixed hyperlipidemia   4. Mild cognitive impairment    PLAN:    In order of problems listed above:  Primary prevention stressed with the patient.  Importance of compliance with diet medication stressed any vocalized understanding. Essential hypertension: Blood pressure stable and diet was emphasized.  No issues with postural hypotension as in the past. Mixed dyslipidemia: Clean diet and tolerating it well.  Not on statin therapy and not keen on it.  I respect his wishes. Paroxysmal atrial tachycardia: Stable and asymptomatic.  Continue to monitor. Patient will be seen in follow-up appointment in 9 months or earlier if the patient has any concerns    Medication Adjustments/Labs and Tests Ordered: Current medicines are reviewed at length with the patient today.  Concerns regarding medicines are outlined above.  Orders Placed This Encounter  Procedures   EKG 12-Lead   No orders of the defined types were placed in this encounter.    No chief complaint on file.    History of Present Illness:    Javier Hall is a 85 y.o. male.  Patient has past medical history of essential hypertension, dyslipidemia and paroxysmal atrial tachycardia.  He denies any problems at this time and takes care of activities of daily living.  No chest pain orthopnea or PND.  He was admitted to the hospital.  He had issues with diarrhea and his blood pressure medications were reduced in dosage and he has tolerated that well.  At the time of my evaluation, the patient is alert awake oriented and in no distress.  Past Medical History:  Diagnosis Date   Alzheimer's dementia [facility])     Arrhythmia    Arthritis    BPH (benign prostatic hyperplasia)    Dementia ([place])    mild   Essential hypertension 03/11/2015   Familial hemochromatosis ([place])    Familial hemochromatosis ([place])    History of kidney stones    Hyperlipidemia    Hypertension    Mild cognitive impairment 10/03/2012   Mixed hyperlipidemia 12/01/2018   Near syncope 03/11/2015   Paroxysmal atrial tachycardia (HCC) 12/01/2018   PONV (postoperative nausea and vomiting)    Renal malignant tumor, left ([place]) 10/18/2018   S/P total knee replacement 11/19/2015   Skin nodule 04/02/2020   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    Past Surgical History:  Procedure Laterality Date   APPENDECTOMY     BOWEL RESECTION     CATARACT EXTRACTION Bilateral 08/2019   CHOLECYSTECTOMY     COLONOSCOPY     cyst removed from neck     IR RADIOLOGIST EVAL & MGMT  11/14/2018   IR RADIOLOGIST EVAL & MGMT  05/22/2019   IR RADIOLOGIST EVAL & MGMT  12/11/2019   IR RADIOLOGIST EVAL & MGMT  06/03/2020   RADIOFREQUENCY ABLATION N/A 10/18/2018   Procedure: MICROWAVE THERMAL ABLATION AND BIOPSY;  Surgeon: Jacqulynn Cadet, MD;  [facility];  Service: Anesthesiology;  Laterality: N/A;   SKIN LESION EXCISION     multiple   TOTAL KNEE ARTHROPLASTY Left 11/19/2015   Procedure: TOTAL KNEE ARTHROPLASTY;  Surgeon: Ninetta Lights, MD;  Location: [place];  Service: Orthopedics;  Laterality: Left;    Current Medications: Current Meds  Medication Sig   aspirin EC 81 MG tablet Take 81 mg by mouth daily.   donepezil (ARICEPT) 10 MG tablet Take 10 mg by mouth daily.   finasteride (PROSCAR) 5 MG tablet Take 5 mg by mouth daily.   lisinopril (ZESTRIL) 20 MG tablet Take 20 mg by mouth daily.    loratadine (CLARITIN) 10 MG tablet Take 10 mg by mouth daily.   meclizine (ANTIVERT) 25 MG tablet Take 25 mg by mouth 3 (three) times daily as needed for dizziness.   memantine (NAMENDA) 10 MG tablet Take 10 mg by mouth 2 (two) times daily.   Multiple Vitamin  (MULTIVITAMIN WITH MINERALS) TABS tablet Take 1 tablet by mouth daily.   tamsulosin (FLOMAX) 0.4 MG CAPS capsule Take 0.4 mg by mouth daily.   [DISCONTINUED] rosuvastatin (CRESTOR) 5 MG tablet Take 5 mg by mouth once a week.     Allergies:   Hydrocodone-acetaminophen   Social History   Socioeconomic History   Marital status: Married    Spouse name: Not on file   Number of children: Not on file   Years of education: Not on file   Highest education level: Not on file  Occupational History   Not on file  Tobacco Use   Smoking status: Never   Smokeless tobacco: Never  Vaping Use   Vaping Use: Never used  Substance and Sexual Activity   Alcohol use: No   Drug use: No   Sexual activity: Not on file  Other Topics Concern   Not on file  Social History Narrative   Not on file   Social Determinants of Health   Financial Resource Strain: Not on file  Food Insecurity: Not on file  Transportation Needs: Not on file  Physical Activity: Not on file  Stress: Not on file  Social Connections: Not on file     Family History: The patient's family history includes Alcohol abuse in his brother; Aneurysm in his father; Breast cancer in his mother and sister; Diabetes in his father; Hypertension in his father.  ROS:   Please see the history of present illness.    All other systems reviewed and are negative.  EKGs/Labs/Other Studies Reviewed:    The following studies were reviewed today: I discussed my findings with the patient at length   Recent Labs: No results found for requested labs within last 8760 hours.  Recent Lipid Panel No results found for: CHOL, TRIG, HDL, CHOLHDL, VLDL, LDLCALC, LDLDIRECT  Physical Exam:    VS:  BP 138/74   Pulse 63   Ht 5\' 11"  (1.803 m)   Wt 189 lb 9.6 oz (86 kg)   SpO2 94%   BMI 26.44 kg/m     Wt Readings from Last 3 Encounters:  11/06/20 189 lb 9.6 oz (86 kg)  04/11/20 190 lb (86.2 kg)  09/18/19 185 lb (83.9 kg)     GEN: Patient is  in no acute distress HEENT: Normal NECK: No JVD; No carotid bruits LYMPHATICS: No lymphadenopathy CARDIAC: Hear sounds regular, 2/6 systolic murmur at the apex. RESPIRATORY:  Clear to auscultation without rales, wheezing or rhonchi  ABDOMEN: Soft, non-tender, non-distended MUSCULOSKELETAL:  No edema; No deformity  SKIN: Warm and dry NEUROLOGIC:  Alert and oriented x 3 PSYCHIATRIC:  Normal affect   Signed, Jenean Lindau, MD  11/06/2020 4:36 PM     [facility]

## 2020-11-06 NOTE — Patient Instructions (Signed)

## 2020-11-18 DIAGNOSIS — N400 Enlarged prostate without lower urinary tract symptoms: Secondary | ICD-10-CM | POA: Diagnosis not present

## 2020-11-18 DIAGNOSIS — Z1331 Encounter for screening for depression: Secondary | ICD-10-CM | POA: Diagnosis not present

## 2020-11-18 DIAGNOSIS — Z79899 Other long term (current) drug therapy: Secondary | ICD-10-CM | POA: Diagnosis not present

## 2020-11-18 DIAGNOSIS — G309 Alzheimer's disease, unspecified: Secondary | ICD-10-CM | POA: Diagnosis not present

## 2020-11-18 DIAGNOSIS — Z9181 History of falling: Secondary | ICD-10-CM | POA: Diagnosis not present

## 2020-11-18 DIAGNOSIS — Z6827 Body mass index (BMI) 27.0-27.9, adult: Secondary | ICD-10-CM | POA: Diagnosis not present

## 2020-11-18 DIAGNOSIS — Z23 Encounter for immunization: Secondary | ICD-10-CM | POA: Diagnosis not present

## 2020-11-18 DIAGNOSIS — I10 Essential (primary) hypertension: Secondary | ICD-10-CM | POA: Diagnosis not present

## 2020-11-18 DIAGNOSIS — E785 Hyperlipidemia, unspecified: Secondary | ICD-10-CM | POA: Diagnosis not present

## 2020-11-18 DIAGNOSIS — F0280 Dementia in other diseases classified elsewhere without behavioral disturbance: Secondary | ICD-10-CM | POA: Diagnosis not present

## 2020-12-16 DIAGNOSIS — Z20822 Contact with and (suspected) exposure to covid-19: Secondary | ICD-10-CM | POA: Diagnosis not present

## 2020-12-16 DIAGNOSIS — R0789 Other chest pain: Secondary | ICD-10-CM | POA: Diagnosis not present

## 2020-12-16 DIAGNOSIS — F0390 Unspecified dementia without behavioral disturbance: Secondary | ICD-10-CM | POA: Diagnosis not present

## 2020-12-16 DIAGNOSIS — R079 Chest pain, unspecified: Secondary | ICD-10-CM | POA: Diagnosis not present

## 2020-12-16 DIAGNOSIS — E785 Hyperlipidemia, unspecified: Secondary | ICD-10-CM | POA: Diagnosis not present

## 2020-12-16 DIAGNOSIS — I10 Essential (primary) hypertension: Secondary | ICD-10-CM | POA: Diagnosis not present

## 2020-12-16 DIAGNOSIS — R0602 Shortness of breath: Secondary | ICD-10-CM | POA: Diagnosis not present

## 2020-12-16 DIAGNOSIS — R110 Nausea: Secondary | ICD-10-CM | POA: Diagnosis not present

## 2020-12-16 DIAGNOSIS — Z79899 Other long term (current) drug therapy: Secondary | ICD-10-CM | POA: Diagnosis not present

## 2020-12-16 DIAGNOSIS — N400 Enlarged prostate without lower urinary tract symptoms: Secondary | ICD-10-CM | POA: Diagnosis not present

## 2020-12-17 DIAGNOSIS — E785 Hyperlipidemia, unspecified: Secondary | ICD-10-CM | POA: Diagnosis not present

## 2020-12-17 DIAGNOSIS — R079 Chest pain, unspecified: Secondary | ICD-10-CM | POA: Diagnosis not present

## 2020-12-17 DIAGNOSIS — I10 Essential (primary) hypertension: Secondary | ICD-10-CM | POA: Diagnosis not present

## 2020-12-23 DIAGNOSIS — K219 Gastro-esophageal reflux disease without esophagitis: Secondary | ICD-10-CM | POA: Diagnosis not present

## 2020-12-23 DIAGNOSIS — G8929 Other chronic pain: Secondary | ICD-10-CM | POA: Diagnosis not present

## 2020-12-23 DIAGNOSIS — R079 Chest pain, unspecified: Secondary | ICD-10-CM | POA: Diagnosis not present

## 2020-12-23 DIAGNOSIS — Z09 Encounter for follow-up examination after completed treatment for conditions other than malignant neoplasm: Secondary | ICD-10-CM | POA: Diagnosis not present

## 2020-12-23 DIAGNOSIS — M549 Dorsalgia, unspecified: Secondary | ICD-10-CM | POA: Diagnosis not present

## 2021-02-26 DIAGNOSIS — R000 Tachycardia, unspecified: Secondary | ICD-10-CM | POA: Diagnosis not present

## 2021-02-26 DIAGNOSIS — R531 Weakness: Secondary | ICD-10-CM | POA: Diagnosis not present

## 2021-02-26 DIAGNOSIS — Z79899 Other long term (current) drug therapy: Secondary | ICD-10-CM | POA: Diagnosis not present

## 2021-02-26 DIAGNOSIS — K5730 Diverticulosis of large intestine without perforation or abscess without bleeding: Secondary | ICD-10-CM | POA: Diagnosis not present

## 2021-02-26 DIAGNOSIS — I10 Essential (primary) hypertension: Secondary | ICD-10-CM | POA: Diagnosis not present

## 2021-02-26 DIAGNOSIS — F0390 Unspecified dementia without behavioral disturbance: Secondary | ICD-10-CM | POA: Diagnosis not present

## 2021-02-26 DIAGNOSIS — E785 Hyperlipidemia, unspecified: Secondary | ICD-10-CM | POA: Diagnosis not present

## 2021-02-26 DIAGNOSIS — R7401 Elevation of levels of liver transaminase levels: Secondary | ICD-10-CM | POA: Diagnosis not present

## 2021-02-26 DIAGNOSIS — R509 Fever, unspecified: Secondary | ICD-10-CM | POA: Diagnosis not present

## 2021-02-26 DIAGNOSIS — A419 Sepsis, unspecified organism: Secondary | ICD-10-CM | POA: Diagnosis not present

## 2021-02-26 DIAGNOSIS — R7989 Other specified abnormal findings of blood chemistry: Secondary | ICD-10-CM | POA: Diagnosis not present

## 2021-02-26 DIAGNOSIS — N3289 Other specified disorders of bladder: Secondary | ICD-10-CM | POA: Diagnosis not present

## 2021-02-26 DIAGNOSIS — Z20822 Contact with and (suspected) exposure to covid-19: Secondary | ICD-10-CM | POA: Diagnosis not present

## 2021-02-26 DIAGNOSIS — R051 Acute cough: Secondary | ICD-10-CM | POA: Diagnosis not present

## 2021-02-26 DIAGNOSIS — R6889 Other general symptoms and signs: Secondary | ICD-10-CM | POA: Diagnosis not present

## 2021-02-26 DIAGNOSIS — Z7982 Long term (current) use of aspirin: Secondary | ICD-10-CM | POA: Diagnosis not present

## 2021-02-26 DIAGNOSIS — N400 Enlarged prostate without lower urinary tract symptoms: Secondary | ICD-10-CM | POA: Diagnosis not present

## 2021-02-26 DIAGNOSIS — N2889 Other specified disorders of kidney and ureter: Secondary | ICD-10-CM | POA: Diagnosis not present

## 2021-02-26 DIAGNOSIS — R059 Cough, unspecified: Secondary | ICD-10-CM | POA: Diagnosis not present

## 2021-02-27 DIAGNOSIS — E785 Hyperlipidemia, unspecified: Secondary | ICD-10-CM | POA: Diagnosis not present

## 2021-02-27 DIAGNOSIS — Z9049 Acquired absence of other specified parts of digestive tract: Secondary | ICD-10-CM | POA: Diagnosis not present

## 2021-02-27 DIAGNOSIS — R748 Abnormal levels of other serum enzymes: Secondary | ICD-10-CM | POA: Diagnosis not present

## 2021-02-27 DIAGNOSIS — F0390 Unspecified dementia without behavioral disturbance: Secondary | ICD-10-CM | POA: Diagnosis not present

## 2021-02-27 DIAGNOSIS — R531 Weakness: Secondary | ICD-10-CM | POA: Diagnosis not present

## 2021-03-18 DIAGNOSIS — Z139 Encounter for screening, unspecified: Secondary | ICD-10-CM | POA: Diagnosis not present

## 2021-03-18 DIAGNOSIS — G309 Alzheimer's disease, unspecified: Secondary | ICD-10-CM | POA: Diagnosis not present

## 2021-03-18 DIAGNOSIS — N400 Enlarged prostate without lower urinary tract symptoms: Secondary | ICD-10-CM | POA: Diagnosis not present

## 2021-03-18 DIAGNOSIS — Z6827 Body mass index (BMI) 27.0-27.9, adult: Secondary | ICD-10-CM | POA: Diagnosis not present

## 2021-03-18 DIAGNOSIS — Z1331 Encounter for screening for depression: Secondary | ICD-10-CM | POA: Diagnosis not present

## 2021-03-18 DIAGNOSIS — I10 Essential (primary) hypertension: Secondary | ICD-10-CM | POA: Diagnosis not present

## 2021-03-18 DIAGNOSIS — E538 Deficiency of other specified B group vitamins: Secondary | ICD-10-CM | POA: Diagnosis not present

## 2021-03-18 DIAGNOSIS — Z79899 Other long term (current) drug therapy: Secondary | ICD-10-CM | POA: Diagnosis not present

## 2021-03-18 DIAGNOSIS — Z9181 History of falling: Secondary | ICD-10-CM | POA: Diagnosis not present

## 2021-03-18 DIAGNOSIS — E785 Hyperlipidemia, unspecified: Secondary | ICD-10-CM | POA: Diagnosis not present

## 2021-03-18 DIAGNOSIS — F0280 Dementia in other diseases classified elsewhere without behavioral disturbance: Secondary | ICD-10-CM | POA: Diagnosis not present

## 2021-03-25 DIAGNOSIS — N400 Enlarged prostate without lower urinary tract symptoms: Secondary | ICD-10-CM | POA: Diagnosis not present

## 2021-03-25 DIAGNOSIS — I10 Essential (primary) hypertension: Secondary | ICD-10-CM | POA: Diagnosis not present

## 2021-03-25 DIAGNOSIS — E663 Overweight: Secondary | ICD-10-CM | POA: Diagnosis not present

## 2021-03-25 DIAGNOSIS — F0280 Dementia in other diseases classified elsewhere without behavioral disturbance: Secondary | ICD-10-CM | POA: Diagnosis not present

## 2021-03-25 DIAGNOSIS — K219 Gastro-esophageal reflux disease without esophagitis: Secondary | ICD-10-CM | POA: Diagnosis not present

## 2021-03-25 DIAGNOSIS — E785 Hyperlipidemia, unspecified: Secondary | ICD-10-CM | POA: Diagnosis not present

## 2021-03-25 DIAGNOSIS — Z7982 Long term (current) use of aspirin: Secondary | ICD-10-CM | POA: Diagnosis not present

## 2021-03-25 DIAGNOSIS — R42 Dizziness and giddiness: Secondary | ICD-10-CM | POA: Diagnosis not present

## 2021-04-23 DIAGNOSIS — N401 Enlarged prostate with lower urinary tract symptoms: Secondary | ICD-10-CM | POA: Diagnosis not present

## 2021-04-23 DIAGNOSIS — R972 Elevated prostate specific antigen [PSA]: Secondary | ICD-10-CM | POA: Diagnosis not present

## 2021-04-23 DIAGNOSIS — N2889 Other specified disorders of kidney and ureter: Secondary | ICD-10-CM | POA: Diagnosis not present

## 2021-05-06 ENCOUNTER — Other Ambulatory Visit: Payer: Self-pay | Admitting: Interventional Radiology

## 2021-05-06 DIAGNOSIS — N2889 Other specified disorders of kidney and ureter: Secondary | ICD-10-CM

## 2021-05-08 ENCOUNTER — Other Ambulatory Visit: Payer: Self-pay | Admitting: Interventional Radiology

## 2021-05-08 DIAGNOSIS — N2889 Other specified disorders of kidney and ureter: Secondary | ICD-10-CM

## 2021-05-15 DIAGNOSIS — Z9049 Acquired absence of other specified parts of digestive tract: Secondary | ICD-10-CM | POA: Diagnosis not present

## 2021-05-15 DIAGNOSIS — R932 Abnormal findings on diagnostic imaging of liver and biliary tract: Secondary | ICD-10-CM | POA: Diagnosis not present

## 2021-05-15 DIAGNOSIS — K5730 Diverticulosis of large intestine without perforation or abscess without bleeding: Secondary | ICD-10-CM | POA: Diagnosis not present

## 2021-05-15 DIAGNOSIS — Z85528 Personal history of other malignant neoplasm of kidney: Secondary | ICD-10-CM | POA: Diagnosis not present

## 2021-05-15 DIAGNOSIS — N2889 Other specified disorders of kidney and ureter: Secondary | ICD-10-CM | POA: Diagnosis not present

## 2021-06-02 ENCOUNTER — Telehealth: Payer: Self-pay | Admitting: *Deleted

## 2021-06-02 ENCOUNTER — Encounter: Payer: Self-pay | Admitting: *Deleted

## 2021-06-02 ENCOUNTER — Ambulatory Visit
Admission: RE | Admit: 2021-06-02 | Discharge: 2021-06-02 | Disposition: A | Discharge status: Home / Self Care | Payer: PPO | Source: Ambulatory Visit | Attending: Interventional Radiology | Admitting: Interventional Radiology

## 2021-06-02 DIAGNOSIS — Z9889 Other specified postprocedural states: Secondary | ICD-10-CM | POA: Diagnosis not present

## 2021-06-02 DIAGNOSIS — K8050 Calculus of bile duct without cholangitis or cholecystitis without obstruction: Secondary | ICD-10-CM | POA: Diagnosis not present

## 2021-06-02 DIAGNOSIS — N2889 Other specified disorders of kidney and ureter: Secondary | ICD-10-CM

## 2021-06-02 DIAGNOSIS — C642 Malignant neoplasm of left kidney, except renal pelvis: Secondary | ICD-10-CM | POA: Diagnosis not present

## 2021-06-02 HISTORY — PX: IR RADIOLOGIST EVAL & MGMT: IMG5224

## 2021-06-02 NOTE — Telephone Encounter (Signed)
Per Dr Katrinka Blazing request s/w Dr. Lyda Jester office to schedule Javier Hall, evaluation for ERCP./vm

## 2021-06-02 NOTE — Progress Notes (Signed)
Chief Complaint: Patient was consulted remotely today (TeleHealth) for left chromophobe RCC at the request of Bastion Bolger K.    Referring Physician(s): Marciel Offenberger K  History of Present Illness: Javier Hall is a 86 y.o. male who initially presented at the kind request of Dr. Nila Nephew to discuss minimally invasion of the options for treatment of a solid left renal mass. Javier Hall was recently admitted with a urinary tract infection. Imaging demonstrated a small but enhancing solid mass arising from the interpolar left kidney. A follow-up MRI of the abdomen was performed confirming findings highly suspicious for a renal cell carcinoma. The mass measures 1.5 x 1.4 cm.  Javier Hall underwent concomitant CT-guided biopsy and percutaneous thermal ablation of the renal lesion on 10/18/2018.  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   His postprocedural follow-up was uneventful and he recovered fully.  We are teleconferencing today to review his 2.5 year follow-up with surveillance MRI imaging.   MRI 05/15/2021:  1. Stable appearing ablation site at the posterior lower pole left kidney. No evidence of recurrent or metastatic disease.   2. Interval development of multiple intra and extrahepatic biliary ductal filling defects consistent with choledocholithiasis.  Associated new mild intrahepatic biliary ductal dilatation. Recommend GI consultation/ERCP.   While he was happy to hear the good news about continued local control of his small left renal cell carcinoma, he was somewhat surprised about the findings in the liver.  He specifically denies right upper quadrant pain, nausea, vomiting, postprandial pain, jaundice or other clinical symptoms.  Fortunately, he already has a relationship with Dr. Lyda Jester who is an excellent gastroenterologist locally in [place].  Past Medical History:  Diagnosis Date   Alzheimer's dementia [facility])    Arrhythmia     Arthritis    BPH (benign prostatic hyperplasia)    Dementia ([place])    mild   Essential hypertension 03/11/2015   Familial hemochromatosis ([place])    Familial hemochromatosis ([place])    History of kidney stones    Hyperlipidemia    Hypertension    Mild cognitive impairment 10/03/2012   Mixed hyperlipidemia 12/01/2018   Near syncope 03/11/2015   Paroxysmal atrial tachycardia (HCC) 12/01/2018   PONV (postoperative nausea and vomiting)    Renal malignant tumor, left ([place]) 10/18/2018   S/P total knee replacement 11/19/2015   Skin nodule 04/02/2020   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    Past Surgical History:  Procedure Laterality Date   APPENDECTOMY     BOWEL RESECTION     CATARACT EXTRACTION Bilateral 08/2019   CHOLECYSTECTOMY     COLONOSCOPY     cyst removed from neck     IR RADIOLOGIST EVAL & MGMT  11/14/2018   IR RADIOLOGIST EVAL & MGMT  05/22/2019   IR RADIOLOGIST EVAL & MGMT  12/11/2019   IR RADIOLOGIST EVAL & MGMT  06/03/2020   RADIOFREQUENCY ABLATION N/A 10/18/2018   Procedure: MICROWAVE THERMAL ABLATION AND BIOPSY;  Surgeon: Jacqulynn Cadet, MD;  [facility];  Service: Anesthesiology;  Laterality: N/A;   SKIN LESION EXCISION     multiple   TOTAL KNEE ARTHROPLASTY Left 11/19/2015   Procedure: TOTAL KNEE ARTHROPLASTY;  Surgeon: Ninetta Lights, MD;  Location: [place];  Service: Orthopedics;  Laterality: Left;    Allergies: Hydrocodone-acetaminophen  Medications: Prior to Admission medications   Medication Sig Start Date End Date Taking? Authorizing Provider  aspirin EC 81 MG tablet Take 81 mg by mouth daily.    [provider]  donepezil (ARICEPT) 10 MG tablet Take 10 mg by mouth daily.    [provider]  finasteride (PROSCAR) 5 MG tablet Take 5 mg by mouth daily.    [provider]  lisinopril (ZESTRIL) 20 MG tablet Take 20 mg by mouth daily.  09/21/15   [provider]  loratadine (CLARITIN) 10 MG tablet Take 10 mg by mouth daily.     [provider]  meclizine (ANTIVERT) 25 MG tablet Take 25 mg by mouth 3 (three) times daily as needed for dizziness.    [provider]  memantine (NAMENDA) 10 MG tablet Take 10 mg by mouth 2 (two) times daily.    [provider]  Multiple Vitamin (MULTIVITAMIN WITH MINERALS) TABS tablet Take 1 tablet by mouth daily.    [provider]  tamsulosin (FLOMAX) 0.4 MG CAPS capsule Take 0.4 mg by mouth daily.    [provider]     Family History  Problem Relation Age of Onset   Breast cancer Mother    Hypertension Father    Diabetes Father    Aneurysm Father    Breast cancer Sister    Alcohol abuse Brother     Social History   Socioeconomic History   Marital status: Married    Spouse name: Not on file   Number of children: Not on file   Years of education: Not on file   Highest education level: Not on file  Occupational History   Not on file  Tobacco Use   Smoking status: Never   Smokeless tobacco: Never  Vaping Use   Vaping Use: Never used  Substance and Sexual Activity   Alcohol use: No   Drug use: No   Sexual activity: Not on file  Other Topics Concern   Not on file  Social History Narrative   Not on file   Social Determinants of Health   Financial Resource Strain: Not on file  Food Insecurity: Not on file  Transportation Needs: Not on file  Physical Activity: Not on file  Stress: Not on file  Social Connections: Not on file    ECOG Status: 0 - Asymptomatic  Review of Systems  Review of Systems: A 12 point ROS discussed and pertinent positives are indicated in the HPI above.  All other systems are negative.  Physical Exam No direct physical exam was performed (except for noted visual exam findings with Video Visits).    Vital Signs: There were no vitals taken for this visit.  Imaging: No results found.  Labs:  CBC: No results for input(s): WBC, HGB, HCT, PLT in the last 8760 hours.  COAGS: Recent Labs     09/19/20 0000  INR 1.0    BMP: No results for input(s): NA, K, CL, CO2, GLUCOSE, BUN, CALCIUM, CREATININE, GFRNONAA, GFRAA in the last 8760 hours.  Invalid input(s): CMP  LIVER FUNCTION TESTS: No results for input(s): BILITOT, AST, ALT, ALKPHOS, PROT, ALBUMIN in the last 8760 hours.  TUMOR MARKERS: No results for input(s): AFPTM, CEA, CA199, CHROMGRNA in the last 8760 hours.  Assessment and Plan:  Very pleasant 86 year old gentleman now 2.5 years status post biopsy and microwave ablation of a left chromophobe renal cell carcinoma.  While he is asymptomatic, his recent MRI demonstrates new choledocholithiasis and mild biliary ductal dilatation.  He already has an existing relationship with Dr. Lyda Jester in [place].  1.)  I contacted Dr. Lyda Jester directly and informed him of these findings.  He agrees that Javier Hall needs  to be seen and evaluated for ERCP.  I will also ask my office to contact his office to facilitate arranging this appointment.  2.)  Continued routine surveillance of left renal microwave ablation site.  Follow-up MRI of the abdomen with gadolinium contrast and accompanying clinic visit in 1 year.    Electronically Signed: Criselda Peaches 06/02/2021, 11:48 AM   I spent a total of  15 Minutes in remote  clinical consultation, greater than 50% of which was counseling/coordinating care for left renal cell carcinoma.    Visit type: Audio only (telephone). Audio (no video) only due to patient preference. Alternative for in-person consultation at [facility], [place] [place] [address], [place], [place]. This visit type was conducted due to national recommendations for restrictions regarding the COVID-19 Pandemic (e.g. social distancing).  This format is felt to be most appropriate for this patient at this time.  All issues noted in this document were discussed and addressed.

## 2021-06-17 DIAGNOSIS — K219 Gastro-esophageal reflux disease without esophagitis: Secondary | ICD-10-CM | POA: Diagnosis not present

## 2021-06-17 DIAGNOSIS — K5790 Diverticulosis of intestine, part unspecified, without perforation or abscess without bleeding: Secondary | ICD-10-CM | POA: Diagnosis not present

## 2021-06-17 DIAGNOSIS — K8050 Calculus of bile duct without cholangitis or cholecystitis without obstruction: Secondary | ICD-10-CM | POA: Diagnosis not present

## 2021-06-24 DIAGNOSIS — F0390 Unspecified dementia without behavioral disturbance: Secondary | ICD-10-CM | POA: Diagnosis not present

## 2021-06-24 DIAGNOSIS — K8050 Calculus of bile duct without cholangitis or cholecystitis without obstruction: Secondary | ICD-10-CM | POA: Diagnosis not present

## 2021-06-24 DIAGNOSIS — G309 Alzheimer's disease, unspecified: Secondary | ICD-10-CM | POA: Diagnosis not present

## 2021-06-24 DIAGNOSIS — Z87891 Personal history of nicotine dependence: Secondary | ICD-10-CM | POA: Diagnosis not present

## 2021-06-24 DIAGNOSIS — I10 Essential (primary) hypertension: Secondary | ICD-10-CM | POA: Diagnosis not present

## 2021-06-24 DIAGNOSIS — F02A0 Dementia in other diseases classified elsewhere, mild, without behavioral disturbance, psychotic disturbance, mood disturbance, and anxiety: Secondary | ICD-10-CM | POA: Diagnosis not present

## 2021-06-24 DIAGNOSIS — Z7982 Long term (current) use of aspirin: Secondary | ICD-10-CM | POA: Diagnosis not present

## 2021-06-25 DIAGNOSIS — F0390 Unspecified dementia without behavioral disturbance: Secondary | ICD-10-CM | POA: Diagnosis not present

## 2021-06-25 DIAGNOSIS — K8050 Calculus of bile duct without cholangitis or cholecystitis without obstruction: Secondary | ICD-10-CM | POA: Diagnosis not present

## 2021-07-22 DIAGNOSIS — Z79899 Other long term (current) drug therapy: Secondary | ICD-10-CM | POA: Diagnosis not present

## 2021-07-22 DIAGNOSIS — E785 Hyperlipidemia, unspecified: Secondary | ICD-10-CM | POA: Diagnosis not present

## 2021-07-22 DIAGNOSIS — F0280 Dementia in other diseases classified elsewhere without behavioral disturbance: Secondary | ICD-10-CM | POA: Diagnosis not present

## 2021-07-22 DIAGNOSIS — Z6827 Body mass index (BMI) 27.0-27.9, adult: Secondary | ICD-10-CM | POA: Diagnosis not present

## 2021-07-22 DIAGNOSIS — N400 Enlarged prostate without lower urinary tract symptoms: Secondary | ICD-10-CM | POA: Diagnosis not present

## 2021-07-22 DIAGNOSIS — G309 Alzheimer's disease, unspecified: Secondary | ICD-10-CM | POA: Diagnosis not present

## 2021-07-22 DIAGNOSIS — I10 Essential (primary) hypertension: Secondary | ICD-10-CM | POA: Diagnosis not present

## 2021-07-29 IMAGING — CT CT GUIDANCE TISSUE ABLATION
1 of 4 series · 12 of 31 positions shown, 15 images · non-contrast
Comparison: MRI abdomen 07/03/2018

INDICATION: 82-year-old male with an enhancing tumor exophytic from the
interpolar left kidney highly concerning for renal cell carcinoma.
He presents for CT-guided biopsy and concurrent percutaneous thermal
ablation.

EXAM:
1. CT-guided core needle biopsy
2. CT-guided percutaneous thermal ablation
TECHNIQUE: Informed written consent was obtained from the patient after a
thorough discussion of the procedural risks, benefits and
alternatives. All questions were addressed. Maximal Sterile Barrier
Technique was utilized including caps, mask, sterile gowns, sterile
gloves, sterile drape, hand hygiene and skin antiseptic. A timeout
was performed prior to the initiation of the procedure.

[Series 2: i-spiral 2.0 b31f · axial · 0.98mm/px · z∈[+1441,+1699]mm · 12 of 155 slices shown, 15 images]
[im 13/155  mediastinal]
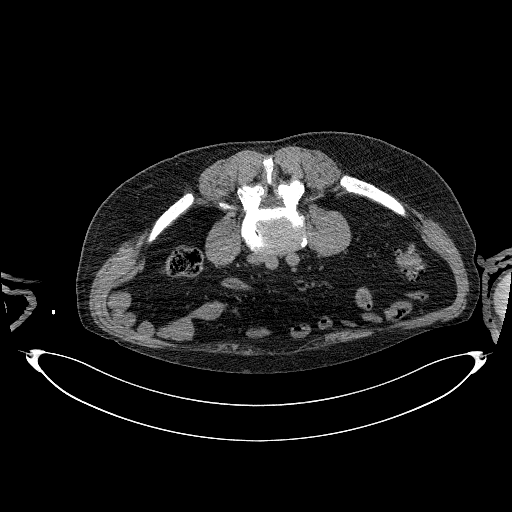
[im 13/155  lung]
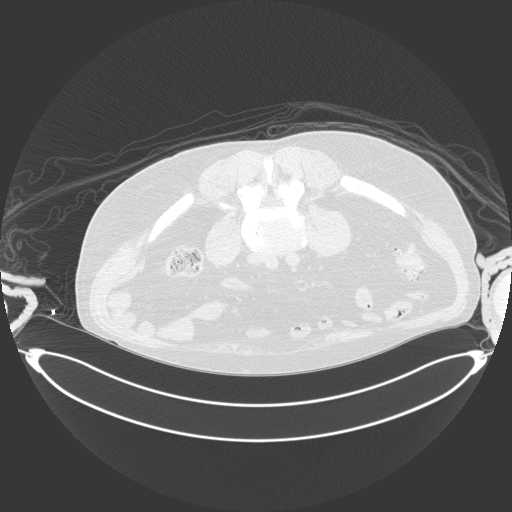
[im 26/155  lung]
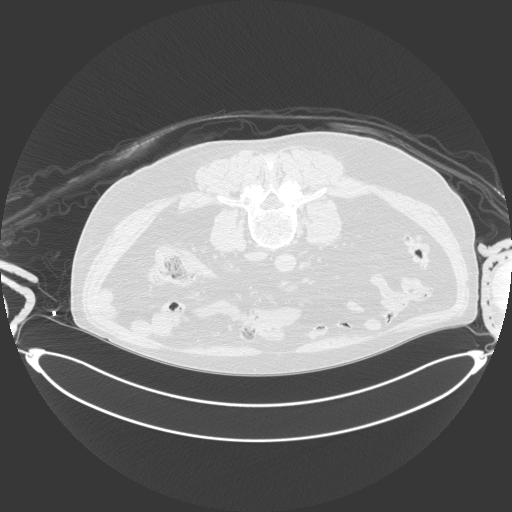
[im 39/155  lung]
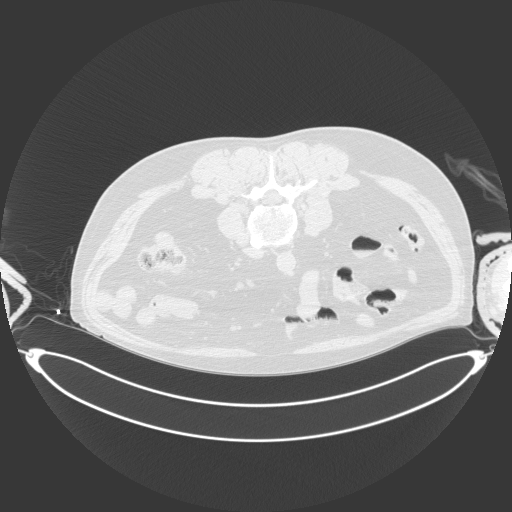
[im 52/155  lung]
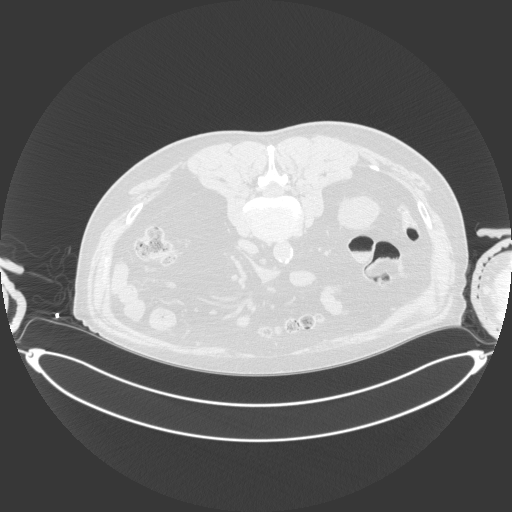
[im 65/155  mediastinal]
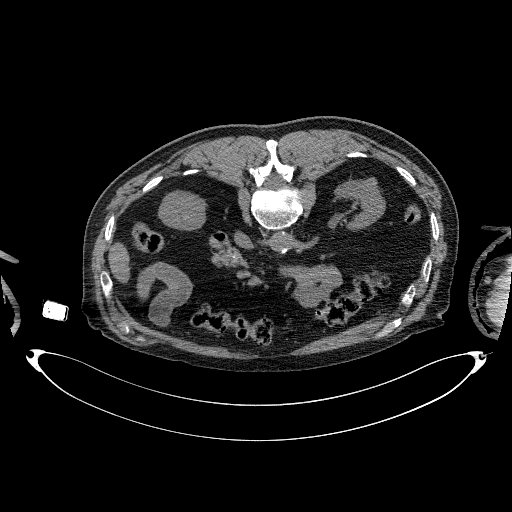
[im 65/155  lung]
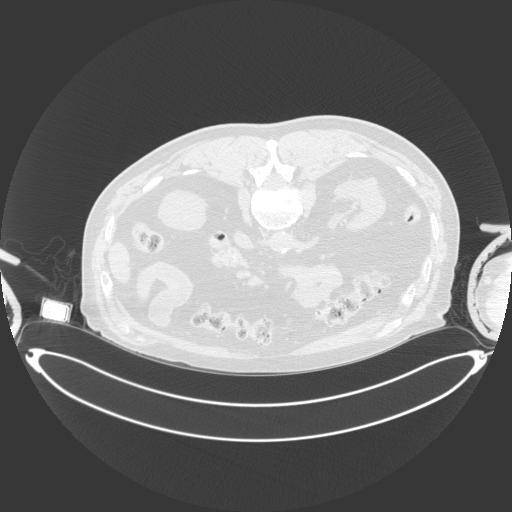
[im 73/155  lung]
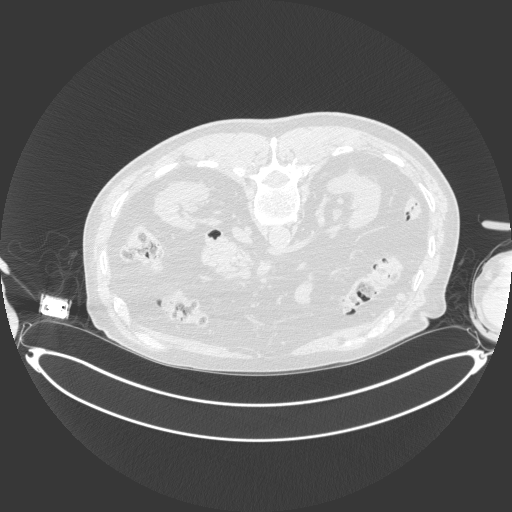
[im 78/155  lung]
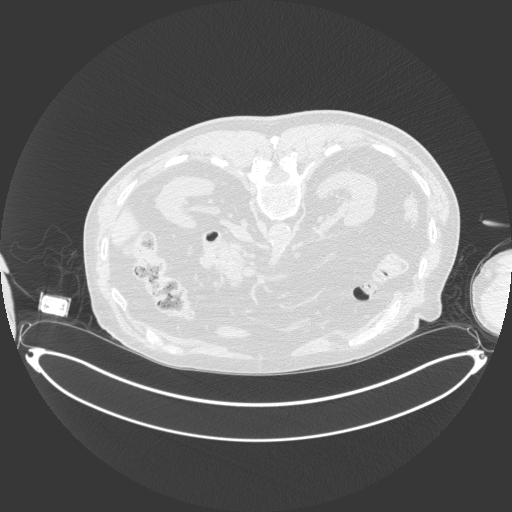
[im 90/155  lung]
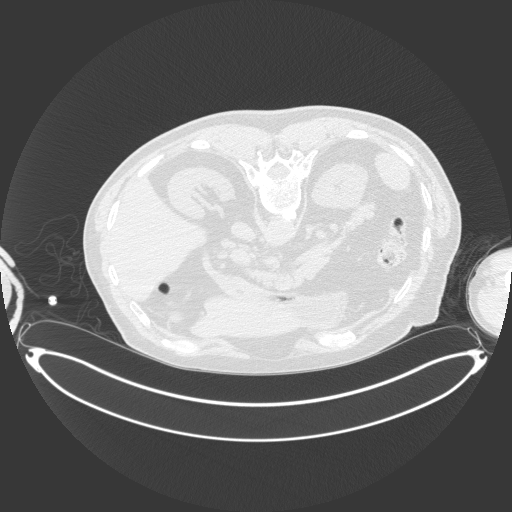
[im 103/155  mediastinal]
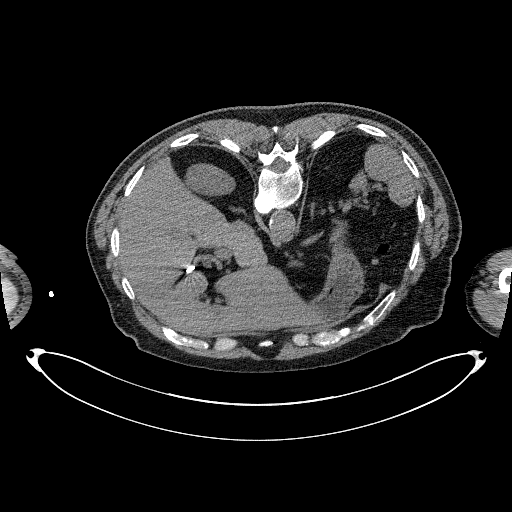
[im 103/155  lung]
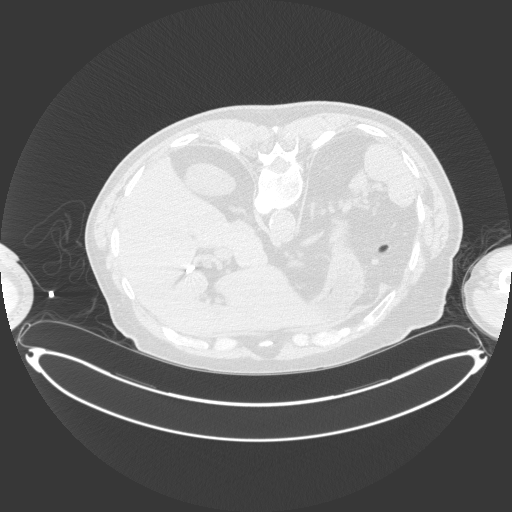
[im 116/155  lung]
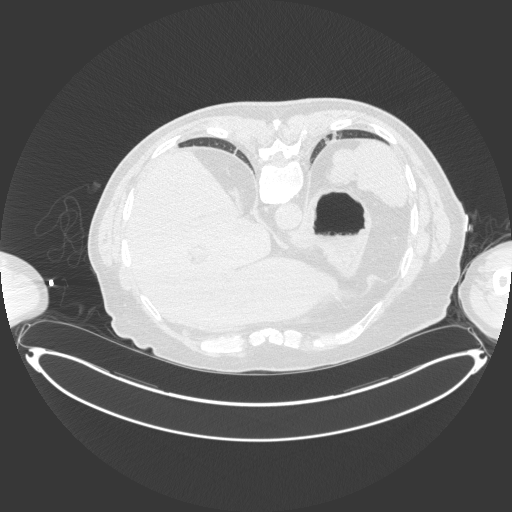
[im 129/155  lung]
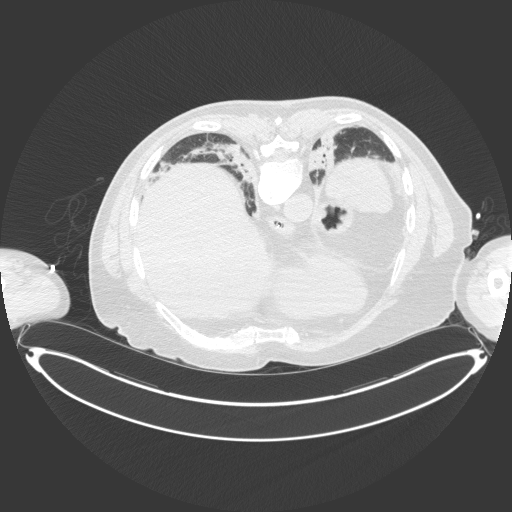
[im 142/155  lung]
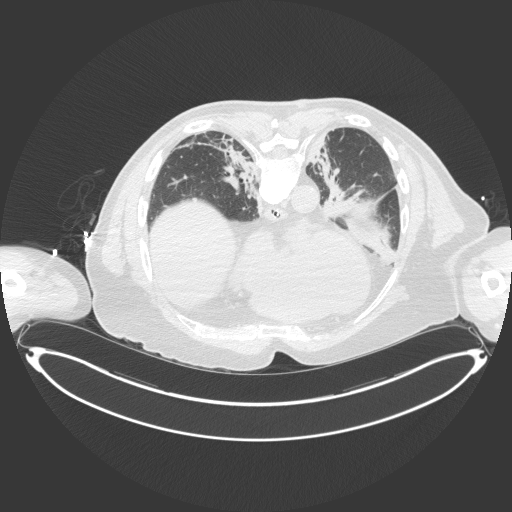

[12 of 31 positions shown; findings below may reference images not displayed]

MEDICATIONS:
2 g Ancef; The antibiotic was administered in an appropriate time
interval prior to needle puncture of the skin.

ANESTHESIA/SEDATION:
General - as administered by the Anesthesia department

FLUOROSCOPY TIME:  None.

COMPLICATIONS:
None immediate.
A planning axial CT scan was performed. The lesion in the interpolar
left kidney was successfully identified. There has been no
significant interval growth compared to the prior imaging.

Appropriate skin entry site was selected and marked. Sterile prep
and drape was then performed in the standard fashion using
chlorhexidine skin prep. A small dermatotomy was made. Under
intermittent CT guidance, a 15 cm PR probe was advanced through the
lesion at the interface with the renal parenchyma. Once the probe
was satisfactorily positioned, a 17 gauge introducer needle was then
advanced and parallel fashion and positioned toward the more
exophytic portion of the lesion. Once in position, two 18 gauge core
biopsies were obtained coaxially using the Chalbi Tomi automated
biopsy device.

The biopsy introducer needle was then removed. Percutaneous thermal
ablation was then performed. The probe was powered at 65 [place] for 5
minutes. Following complete ablation, the probe was removed.
Follow-up CT imaging enhanced with intravenous contrast demonstrates
an excellent ablation zone completely encompassing the lesion. No
evidence of bleeding or other complication.
FINDINGS: Technically successful biopsy and percutaneous thermal ablation.
IMPRESSION: 1. Technically successful CT-guided core biopsy of left renal
lesion.
2. Technically successful CT-guided percutaneous thermal ablation of
left renal lesion.

## 2021-08-03 DIAGNOSIS — K8050 Calculus of bile duct without cholangitis or cholecystitis without obstruction: Secondary | ICD-10-CM | POA: Diagnosis not present

## 2021-08-03 DIAGNOSIS — K5790 Diverticulosis of intestine, part unspecified, without perforation or abscess without bleeding: Secondary | ICD-10-CM | POA: Diagnosis not present

## 2021-08-03 DIAGNOSIS — K219 Gastro-esophageal reflux disease without esophagitis: Secondary | ICD-10-CM | POA: Diagnosis not present

## 2021-08-04 ENCOUNTER — Ambulatory Visit: Payer: PPO | Admitting: Cardiology

## 2021-08-18 ENCOUNTER — Encounter: Payer: Self-pay | Admitting: Cardiology

## 2021-08-18 ENCOUNTER — Ambulatory Visit: Payer: PPO | Admitting: Cardiology

## 2021-08-18 VITALS — BP 152/60 | HR 61 | Ht 71.5 in | Wt 195.8 lb

## 2021-08-18 DIAGNOSIS — I471 Supraventricular tachycardia: Secondary | ICD-10-CM

## 2021-08-18 DIAGNOSIS — E782 Mixed hyperlipidemia: Secondary | ICD-10-CM | POA: Diagnosis not present

## 2021-08-18 DIAGNOSIS — I10 Essential (primary) hypertension: Secondary | ICD-10-CM | POA: Diagnosis not present

## 2021-08-18 NOTE — Progress Notes (Signed)
Cardiology Office Note:    Date:  08/18/2021   ID:  Javier Hall, DOB 15-Jul-1935, MRN [id number]  PCP:  Helen Hashimoto., MD  Cardiologist:  Jenean Lindau, MD   Referring MD: Helen Hashimoto., MD    ASSESSMENT:    1. Essential hypertension   2. Mixed hyperlipidemia   3. Paroxysmal atrial tachycardia (HCC)    PLAN:    In order of problems listed above:  Primary prevention stressed with the patient.  Importance of compliance with diet medication stressed and vocalized understanding. Essential hypertension: Blood pressure stable and diet was emphasized.  He has an element of whitecoat hypertension and his daughter agrees that his blood pressures are better at home. History of mixed dyslipidemia managed by diet.  Followed by primary care. Paroxysmal atrial tachycardia: Stable and asymptomatic.  We will continue to monitor. He was advised to walk at least half an hour a day 5 days a week and he promises to do so.   Medication Adjustments/Labs and Tests Ordered: Current medicines are reviewed at length with the patient today.  Concerns regarding medicines are outlined above.  No orders of the defined types were placed in this encounter.  No orders of the defined types were placed in this encounter.    Chief Complaint  Patient presents with   Follow-up     History of Present Illness:    Javier Hall is a 86 y.o. male.  Patient has past medical history of essential hypertension, dyslipidemia and paroxysmal atrial tachycardia.  He denies any problems at this time and takes care of activities of daily living.  His daughter accompanies him for this visit and is very supportive.  He lives with his son.  At the time of my evaluation, the patient is alert awake oriented and in no distress.  Past Medical History:  Diagnosis Date   Alzheimer's dementia [facility])    Arrhythmia    Arthritis    BPH (benign prostatic hyperplasia)    Dementia ([place])    mild    Essential hypertension 03/11/2015   Familial hemochromatosis ([place])    Familial hemochromatosis ([place])    History of kidney stones    Hyperlipidemia    Hypertension    Mild cognitive impairment 10/03/2012   Mixed hyperlipidemia 12/01/2018   Near syncope 03/11/2015   Paroxysmal atrial tachycardia (HCC) 12/01/2018   PONV (postoperative nausea and vomiting)    Renal malignant tumor, left ([place]) 10/18/2018   S/P total knee replacement 11/19/2015   Skin nodule 04/02/2020   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    Past Surgical History:  Procedure Laterality Date   APPENDECTOMY     BOWEL RESECTION     CATARACT EXTRACTION Bilateral 08/2019   CHOLECYSTECTOMY     COLONOSCOPY     cyst removed from neck     IR RADIOLOGIST EVAL & MGMT  11/14/2018   IR RADIOLOGIST EVAL & MGMT  05/22/2019   IR RADIOLOGIST EVAL & MGMT  12/11/2019   IR RADIOLOGIST EVAL & MGMT  06/03/2020   IR RADIOLOGIST EVAL & MGMT  06/02/2021   RADIOFREQUENCY ABLATION N/A 10/18/2018   Procedure: MICROWAVE THERMAL ABLATION AND BIOPSY;  Surgeon: Jacqulynn Cadet, MD;  [facility];  Service: Anesthesiology;  Laterality: N/A;   SKIN LESION EXCISION     multiple   TOTAL KNEE ARTHROPLASTY Left 11/19/2015   Procedure: TOTAL KNEE ARTHROPLASTY;  Surgeon: Ninetta Lights, MD;  Location: [place];  Service: Orthopedics;  Laterality: Left;  Current Medications: Current Meds  Medication Sig   aspirin EC 81 MG tablet Take 81 mg by mouth daily.   donepezil (ARICEPT) 10 MG tablet Take 10 mg by mouth daily.   finasteride (PROSCAR) 5 MG tablet Take 5 mg by mouth daily.   lisinopril (ZESTRIL) 20 MG tablet Take 20 mg by mouth daily.    loratadine (CLARITIN) 10 MG tablet Take 10 mg by mouth daily.   meclizine (ANTIVERT) 25 MG tablet Take 25 mg by mouth 3 (three) times daily as needed for dizziness.   memantine (NAMENDA) 10 MG tablet Take 10 mg by mouth 2 (two) times daily.   Multiple Vitamin (MULTIVITAMIN WITH MINERALS) TABS tablet Take 1 tablet by  mouth daily.   tamsulosin (FLOMAX) 0.4 MG CAPS capsule Take 0.4 mg by mouth daily.     Allergies:   Hydrocodone-acetaminophen   Social History   Socioeconomic History   Marital status: Married    Spouse name: Not on file   Number of children: Not on file   Years of education: Not on file   Highest education level: Not on file  Occupational History   Not on file  Tobacco Use   Smoking status: Never   Smokeless tobacco: Never  Vaping Use   Vaping Use: Never used  Substance and Sexual Activity   Alcohol use: No   Drug use: No   Sexual activity: Not Currently  Other Topics Concern   Not on file  Social History Narrative   Not on file   Social Determinants of Health   Financial Resource Strain: Not on file  Food Insecurity: Not on file  Transportation Needs: Not on file  Physical Activity: Not on file  Stress: Not on file  Social Connections: Not on file     Family History: The patient's family history includes Alcohol abuse in his brother; Aneurysm in his father; Breast cancer in his mother and sister; Diabetes in his father; Hypertension in his father.  ROS:   Please see the history of present illness.    All other systems reviewed and are negative.  EKGs/Labs/Other Studies Reviewed:    The following studies were reviewed today: I discussed my findings with the patient at length.   Recent Labs: No results found for requested labs within last 365 days.  Recent Lipid Panel No results found for: "CHOL", "TRIG", "HDL", "CHOLHDL", "VLDL", "LDLCALC", "LDLDIRECT"  Physical Exam:    VS:  BP (!) 152/60 (BP Location: Left Arm, Patient Position: Sitting)   Pulse 61   Ht 5' 11.5" (1.816 m)   Wt 195 lb 12.8 oz (88.8 kg)   SpO2 91%   BMI 26.93 kg/m     Wt Readings from Last 3 Encounters:  08/18/21 195 lb 12.8 oz (88.8 kg)  11/06/20 189 lb 9.6 oz (86 kg)  04/11/20 190 lb (86.2 kg)     GEN: Patient is in no acute distress HEENT: Normal NECK: No JVD; No carotid  bruits LYMPHATICS: No lymphadenopathy CARDIAC: Hear sounds regular, 2/6 systolic murmur at the apex. RESPIRATORY:  Clear to auscultation without rales, wheezing or rhonchi  ABDOMEN: Soft, non-tender, non-distended MUSCULOSKELETAL:  No edema; No deformity  SKIN: Warm and dry NEUROLOGIC:  Alert and oriented x 3 PSYCHIATRIC:  Normal affect   Signed, Jenean Lindau, MD  08/18/2021 1:07 PM     [facility]

## 2021-08-18 NOTE — Patient Instructions (Signed)
Medication Instructions:  Your physician recommends that you continue on your current medications as directed. Please refer to the Current Medication list given to you today.  *If you need a refill on your cardiac medications before your next appointment, please call your pharmacy*   Lab Work: NONE If you have labs (blood work) drawn today and your tests are completely normal, you will receive your results only by: [place] (if you have MyChart) OR A paper copy in the mail If you have any lab test that is abnormal or we need to change your treatment, we will call you to review the results.   Testing/Procedures: NONE   Follow-Up: At [facility], you and your health needs are our priority.  As part of our continuing mission to provide you with exceptional heart care, we have created designated Provider Care Teams.  These Care Teams include your primary Cardiologist (physician) and Advanced Practice Providers (APPs -  Physician Assistants and Nurse Practitioners) who all work together to provide you with the care you need, when you need it.  We recommend signing up for the patient portal called "MyChart".  Sign up information is provided on this After Visit Summary.  MyChart is used to connect with patients for Virtual Visits (Telemedicine).  Patients are able to view lab/test results, encounter notes, upcoming appointments, etc.  Non-urgent messages can be sent to your provider as well.   To learn more about what you can do with MyChart, go to [email or web address].    Your next appointment:   1 year(s)  The format for your next appointment:   In Person  Provider:   Jyl Heinz, MD    Other Instructions   Important Information About Sugar

## 2021-10-12 DIAGNOSIS — Z6827 Body mass index (BMI) 27.0-27.9, adult: Secondary | ICD-10-CM | POA: Diagnosis not present

## 2021-10-12 DIAGNOSIS — M545 Low back pain, unspecified: Secondary | ICD-10-CM | POA: Diagnosis not present

## 2021-10-26 DIAGNOSIS — N401 Enlarged prostate with lower urinary tract symptoms: Secondary | ICD-10-CM | POA: Diagnosis not present

## 2021-10-26 DIAGNOSIS — R972 Elevated prostate specific antigen [PSA]: Secondary | ICD-10-CM | POA: Diagnosis not present

## 2021-10-26 DIAGNOSIS — N2889 Other specified disorders of kidney and ureter: Secondary | ICD-10-CM | POA: Diagnosis not present

## 2021-11-14 DIAGNOSIS — J069 Acute upper respiratory infection, unspecified: Secondary | ICD-10-CM | POA: Diagnosis not present

## 2021-12-01 DIAGNOSIS — E785 Hyperlipidemia, unspecified: Secondary | ICD-10-CM | POA: Diagnosis not present

## 2021-12-01 DIAGNOSIS — Z23 Encounter for immunization: Secondary | ICD-10-CM | POA: Diagnosis not present

## 2021-12-01 DIAGNOSIS — Z6828 Body mass index (BMI) 28.0-28.9, adult: Secondary | ICD-10-CM | POA: Diagnosis not present

## 2021-12-01 DIAGNOSIS — I10 Essential (primary) hypertension: Secondary | ICD-10-CM | POA: Diagnosis not present

## 2021-12-01 DIAGNOSIS — Z79899 Other long term (current) drug therapy: Secondary | ICD-10-CM | POA: Diagnosis not present

## 2021-12-01 DIAGNOSIS — G309 Alzheimer's disease, unspecified: Secondary | ICD-10-CM | POA: Diagnosis not present

## 2021-12-23 DIAGNOSIS — R059 Cough, unspecified: Secondary | ICD-10-CM | POA: Diagnosis not present

## 2021-12-23 DIAGNOSIS — J019 Acute sinusitis, unspecified: Secondary | ICD-10-CM | POA: Diagnosis not present

## 2021-12-23 DIAGNOSIS — R0981 Nasal congestion: Secondary | ICD-10-CM | POA: Diagnosis not present

## 2021-12-23 DIAGNOSIS — J209 Acute bronchitis, unspecified: Secondary | ICD-10-CM | POA: Diagnosis not present

## 2022-02-11 DIAGNOSIS — E663 Overweight: Secondary | ICD-10-CM | POA: Diagnosis not present

## 2022-02-11 DIAGNOSIS — M1990 Unspecified osteoarthritis, unspecified site: Secondary | ICD-10-CM | POA: Diagnosis not present

## 2022-02-11 DIAGNOSIS — F0280 Dementia in other diseases classified elsewhere without behavioral disturbance: Secondary | ICD-10-CM | POA: Diagnosis not present

## 2022-02-11 DIAGNOSIS — G8929 Other chronic pain: Secondary | ICD-10-CM | POA: Diagnosis not present

## 2022-02-11 DIAGNOSIS — Z9849 Cataract extraction status, unspecified eye: Secondary | ICD-10-CM | POA: Diagnosis not present

## 2022-02-11 DIAGNOSIS — I10 Essential (primary) hypertension: Secondary | ICD-10-CM | POA: Diagnosis not present

## 2022-02-11 DIAGNOSIS — Z7982 Long term (current) use of aspirin: Secondary | ICD-10-CM | POA: Diagnosis not present

## 2022-02-11 DIAGNOSIS — N400 Enlarged prostate without lower urinary tract symptoms: Secondary | ICD-10-CM | POA: Diagnosis not present

## 2022-02-11 DIAGNOSIS — R42 Dizziness and giddiness: Secondary | ICD-10-CM | POA: Diagnosis not present

## 2022-02-11 DIAGNOSIS — J309 Allergic rhinitis, unspecified: Secondary | ICD-10-CM | POA: Diagnosis not present

## 2022-02-11 DIAGNOSIS — K219 Gastro-esophageal reflux disease without esophagitis: Secondary | ICD-10-CM | POA: Diagnosis not present

## 2022-03-31 DIAGNOSIS — Z6827 Body mass index (BMI) 27.0-27.9, adult: Secondary | ICD-10-CM | POA: Diagnosis not present

## 2022-03-31 DIAGNOSIS — Z85528 Personal history of other malignant neoplasm of kidney: Secondary | ICD-10-CM | POA: Diagnosis not present

## 2022-03-31 DIAGNOSIS — D692 Other nonthrombocytopenic purpura: Secondary | ICD-10-CM | POA: Diagnosis not present

## 2022-03-31 DIAGNOSIS — F0280 Dementia in other diseases classified elsewhere without behavioral disturbance: Secondary | ICD-10-CM | POA: Diagnosis not present

## 2022-03-31 DIAGNOSIS — I10 Essential (primary) hypertension: Secondary | ICD-10-CM | POA: Diagnosis not present

## 2022-03-31 DIAGNOSIS — N400 Enlarged prostate without lower urinary tract symptoms: Secondary | ICD-10-CM | POA: Diagnosis not present

## 2022-03-31 DIAGNOSIS — Z79899 Other long term (current) drug therapy: Secondary | ICD-10-CM | POA: Diagnosis not present

## 2022-03-31 DIAGNOSIS — G309 Alzheimer's disease, unspecified: Secondary | ICD-10-CM | POA: Diagnosis not present

## 2022-03-31 DIAGNOSIS — E785 Hyperlipidemia, unspecified: Secondary | ICD-10-CM | POA: Diagnosis not present

## 2022-04-19 ENCOUNTER — Other Ambulatory Visit: Payer: Self-pay | Admitting: Interventional Radiology

## 2022-04-19 DIAGNOSIS — N2889 Other specified disorders of kidney and ureter: Secondary | ICD-10-CM

## 2022-04-28 DIAGNOSIS — R972 Elevated prostate specific antigen [PSA]: Secondary | ICD-10-CM | POA: Diagnosis not present

## 2022-04-28 DIAGNOSIS — N401 Enlarged prostate with lower urinary tract symptoms: Secondary | ICD-10-CM | POA: Diagnosis not present

## 2022-04-28 DIAGNOSIS — N2889 Other specified disorders of kidney and ureter: Secondary | ICD-10-CM | POA: Diagnosis not present

## 2022-05-19 DIAGNOSIS — N2889 Other specified disorders of kidney and ureter: Secondary | ICD-10-CM | POA: Diagnosis not present

## 2022-05-19 DIAGNOSIS — C642 Malignant neoplasm of left kidney, except renal pelvis: Secondary | ICD-10-CM | POA: Diagnosis not present

## 2022-06-03 ENCOUNTER — Telehealth: Payer: PPO

## 2022-06-17 ENCOUNTER — Ambulatory Visit
Admission: RE | Admit: 2022-06-17 | Discharge: 2022-06-17 | Disposition: A | Discharge status: Home / Self Care | Payer: PPO | Source: Ambulatory Visit | Attending: Interventional Radiology | Admitting: Interventional Radiology

## 2022-06-17 DIAGNOSIS — N2889 Other specified disorders of kidney and ureter: Secondary | ICD-10-CM

## 2022-06-17 HISTORY — PX: IR RADIOLOGIST EVAL & MGMT: IMG5224

## 2022-06-17 NOTE — Progress Notes (Signed)
Chief Complaint: Patient was consulted remotely today (TeleHealth) for left chromophobe RCC at the request of [name].    Referring Physician(s): Alexande Sheerin K  History of Present Illness: Javier Hall is a 87 y.o. male who initially presented at the kind request of Dr. Saddie Benders to discuss minimally invasion of the options for treatment of a solid left renal mass. [name] was recently admitted with a urinary tract infection. Imaging demonstrated a small but enhancing solid mass arising from the interpolar left kidney. A follow-up MRI of the abdomen was performed confirming findings highly suspicious for a renal cell carcinoma. The mass measures 1.5 x 1.4 cm.  [name] underwent concomitant CT-guided biopsy and percutaneous thermal ablation of the renal lesion on 10/18/2018.  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   His postprocedural follow-up was uneventful and he recovered fully.  We are teleconferencing today to review his 2.5 year follow-up with surveillance MRI imaging.   MRI 05/15/2021:  1. Stable appearing ablation site at the posterior lower pole left kidney. No evidence of recurrent or metastatic disease.   2. Interval development of multiple intra and extrahepatic biliary ductal filling defects consistent with choledocholithiasis.  Associated new mild intrahepatic biliary ductal dilatation. Recommend GI consultation/ERCP.   MRI 05/19/22 Status post left lower pole renal ablation. No findings suspicious for recurrent or metastatic disease.   He is otherwise doing well.  No current clinical issues.  Past Medical History:  Diagnosis Date   Alzheimer's dementia [facility])    Arrhythmia    Arthritis    BPH (benign prostatic hyperplasia)    Dementia (HCC)    mild   Essential hypertension 03/11/2015   Familial hemochromatosis (HCC)    Familial hemochromatosis (HCC)    History of kidney stones    Hyperlipidemia     Hypertension    Mild cognitive impairment 10/03/2012   Mixed hyperlipidemia 12/01/2018   Near syncope 03/11/2015   Paroxysmal atrial tachycardia 12/01/2018   PONV (postoperative nausea and vomiting)    Renal malignant tumor, left (HCC) 10/18/2018   S/P total knee replacement 11/19/2015   Skin nodule 04/02/2020   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    Past Surgical History:  Procedure Laterality Date   APPENDECTOMY     BOWEL RESECTION     CATARACT EXTRACTION Bilateral 08/2019   CHOLECYSTECTOMY     COLONOSCOPY     cyst removed from neck     IR RADIOLOGIST EVAL & MGMT  11/14/2018   IR RADIOLOGIST EVAL & MGMT  05/22/2019   IR RADIOLOGIST EVAL & MGMT  12/11/2019   IR RADIOLOGIST EVAL & MGMT  06/03/2020   IR RADIOLOGIST EVAL & MGMT  06/02/2021   IR RADIOLOGIST EVAL & MGMT  06/17/2022   RADIOFREQUENCY ABLATION N/A 10/18/2018   Procedure: MICROWAVE THERMAL ABLATION AND BIOPSY;  Surgeon: Malachy Moan, MD;  Location: [facility]S;  Service: Anesthesiology;  Laterality: N/A;   SKIN LESION EXCISION     multiple   TOTAL KNEE ARTHROPLASTY Left 11/19/2015   Procedure: TOTAL KNEE ARTHROPLASTY;  Surgeon: Loreta Ave, MD;  Location[facility];  Service: Orthopedics;  Laterality: Left;    Allergies: Hydrocodone-acetaminophen  Medications: Prior to Admission medications   Medication Sig Start Date End Date Taking? Authorizing Provider  aspirin EC 81 MG tablet Take 81 mg by mouth daily.    [provider]  donepezil (ARICEPT) 10 MG tablet Take 10 mg by mouth daily.    [provider]  finasteride (  PROSCAR) 5 MG tablet Take 5 mg by mouth daily.    [provider]  lisinopril (ZESTRIL) 20 MG tablet Take 20 mg by mouth daily.  09/21/15   [provider]  loratadine (CLARITIN) 10 MG tablet Take 10 mg by mouth daily.    [provider]  meclizine (ANTIVERT) 25 MG tablet Take 25 mg by mouth 3 (three) times daily as needed for dizziness.    [provider]   memantine (NAMENDA) 10 MG tablet Take 10 mg by mouth 2 (two) times daily.    [provider]  Multiple Vitamin (MULTIVITAMIN WITH MINERALS) TABS tablet Take 1 tablet by mouth daily.    [provider]  tamsulosin (FLOMAX) 0.4 MG CAPS capsule Take 0.4 mg by mouth daily.    [provider]     Family History  Problem Relation Age of Onset   Breast cancer Mother    Hypertension Father    Diabetes Father    Aneurysm Father    Breast cancer Sister    Alcohol abuse Brother     Social History   Socioeconomic History   Marital status: Married    Spouse name: Not on file   Number of children: Not on file   Years of education: Not on file   Highest education level: Not on file  Occupational History   Not on file  Tobacco Use   Smoking status: Never   Smokeless tobacco: Never  Vaping Use   Vaping Use: Never used  Substance and Sexual Activity   Alcohol use: No   Drug use: No   Sexual activity: Not Currently  Other Topics Concern   Not on file  Social History Narrative   Not on file   Social Determinants of Health   Financial Resource Strain: Not on file  Food Insecurity: Not on file  Transportation Needs: Not on file  Physical Activity: Not on file  Stress: Not on file  Social Connections: Not on file    ECOG Status: 0 - Asymptomatic  Review of Systems  Review of Systems: A 12 point ROS discussed and pertinent positives are indicated in the HPI above.  All other systems are negative.  Advance Care Plan: The advanced care plan/surrogate decision maker was discussed at the time of visit and the patient did not wish to discuss or was not able to name a surrogate decision maker or provide an advance care plan.    Physical Exam No direct physical exam was performed (except for noted visual exam findings with Video Visits).    Vital Signs: There were no vitals taken for this visit.  Imaging: IR Radiologist Eval & Mgmt  Result Date:  06/17/2022 EXAM: ESTABLISHED PATIENT OFFICE VISIT CHIEF COMPLAINT: SEE EPIC NOTE HISTORY OF PRESENT ILLNESS: SEE EPIC NOTE REVIEW OF SYSTEMS: SEE EPIC NOTE PHYSICAL EXAMINATION: SEE EPIC NOTE ASSESSMENT AND PLAN: SEE EPIC NOTE Electronically Signed   By: Malachy Moan M.D.   On: 06/17/2022 14:11    Labs:  CBC: No results for input(s): "WBC", "HGB", "HCT", "PLT" in the last 8760 hours.  COAGS: No results for input(s): "INR", "APTT" in the last 8760 hours.  BMP: No results for input(s): "NA", "K", "CL", "CO2", "GLUCOSE", "BUN", "CALCIUM", "CREATININE", "GFRNONAA", "GFRAA" in the last 8760 hours.  Invalid input(s): "CMP"  LIVER FUNCTION TESTS: No results for input(s): "BILITOT", "AST", "ALT", "ALKPHOS", "PROT", "ALBUMIN" in the last 8760 hours.  TUMOR MARKERS: No results for input(s): "AFPTM", "CEA", "CA199", "CHROMGRNA" in  the last 8760 hours.  Assessment and Plan:  Extremely pleasant 87 year old gentleman doing very well 3.5 years status post percutaneous microwave ablation of left lower pole renal cell carcinoma.  1.  Follow-up MRI of the abdomen with contrast and clinic visit in October 2025 to confirm a 5-year stability.    Electronically Signed: Sterling Big 06/17/2022, 2:40 PM   I spent a total of   15 Minutes in remote  clinical consultation, greater than 50% of which was counseling/coordinating care for left renal cell carcinoma.    Visit type: Audio only (telephone). Audio (no video) only due to patient preference. Alternative for in-person consultation at [facility], [address], [place], [place]. This visit type was conducted due to national recommendations for restrictions regarding the COVID-19 Pandemic (e.g. social distancing).  This format is felt to be most appropriate for this patient at this time.  All issues noted in this document were discussed and addressed.

## 2022-07-05 DIAGNOSIS — Z9181 History of falling: Secondary | ICD-10-CM | POA: Diagnosis not present

## 2022-07-05 DIAGNOSIS — Z0000 Encounter for general adult medical examination without abnormal findings: Secondary | ICD-10-CM | POA: Diagnosis not present

## 2022-08-03 DIAGNOSIS — N400 Enlarged prostate without lower urinary tract symptoms: Secondary | ICD-10-CM | POA: Diagnosis not present

## 2022-08-03 DIAGNOSIS — D692 Other nonthrombocytopenic purpura: Secondary | ICD-10-CM | POA: Diagnosis not present

## 2022-08-03 DIAGNOSIS — E785 Hyperlipidemia, unspecified: Secondary | ICD-10-CM | POA: Diagnosis not present

## 2022-08-03 DIAGNOSIS — G309 Alzheimer's disease, unspecified: Secondary | ICD-10-CM | POA: Diagnosis not present

## 2022-08-03 DIAGNOSIS — Z85528 Personal history of other malignant neoplasm of kidney: Secondary | ICD-10-CM | POA: Diagnosis not present

## 2022-08-03 DIAGNOSIS — Z79899 Other long term (current) drug therapy: Secondary | ICD-10-CM | POA: Diagnosis not present

## 2022-08-03 DIAGNOSIS — Z6827 Body mass index (BMI) 27.0-27.9, adult: Secondary | ICD-10-CM | POA: Diagnosis not present

## 2022-08-03 DIAGNOSIS — I10 Essential (primary) hypertension: Secondary | ICD-10-CM | POA: Diagnosis not present

## 2022-08-03 DIAGNOSIS — F0280 Dementia in other diseases classified elsewhere without behavioral disturbance: Secondary | ICD-10-CM | POA: Diagnosis not present

## 2022-08-20 ENCOUNTER — Other Ambulatory Visit: Payer: Self-pay

## 2022-08-25 ENCOUNTER — Encounter: Payer: Self-pay | Admitting: Cardiology

## 2022-08-25 ENCOUNTER — Ambulatory Visit: Payer: PPO | Attending: Cardiology | Admitting: Cardiology

## 2022-08-25 VITALS — BP 134/64 | HR 61 | Ht 71.0 in | Wt 193.2 lb

## 2022-08-25 DIAGNOSIS — I4719 Other supraventricular tachycardia: Secondary | ICD-10-CM | POA: Diagnosis not present

## 2022-08-25 DIAGNOSIS — I10 Essential (primary) hypertension: Secondary | ICD-10-CM | POA: Diagnosis not present

## 2022-08-25 NOTE — Progress Notes (Signed)
Cardiology Office Note:    Date:  08/25/2022   ID:  Javier Hall, DOB [birth year], MRN [id number]  PCP:  Javier Hall., MD  Cardiologist:  Javier Brothers, MD   Referring MD: Javier Hall., MD    ASSESSMENT:    1. Essential hypertension   2. Paroxysmal atrial tachycardia    PLAN:    In order of problems listed above:  Primary prevention stressed with the patient.  Importance of compliance with diet medication stressed and patient verbalized standing. Patient was advised to ambulate to the best of his ability. Paroxysmal atrial tachycardia: Stable.  Asymptomatic.  No palpitations or any such issues. Essential hypertension: Blood pressure stable and diet was emphasized. Patient will be seen in follow-up appointment in 12 months or earlier if the patient has any concerns.    Medication Adjustments/Labs and Tests Ordered: Current medicines are reviewed at length with the patient today.  Concerns regarding medicines are outlined above.  Orders Placed This Encounter  Procedures   EKG 12-Lead   No orders of the defined types were placed in this encounter.    No chief complaint on file.    History of Present Illness:    Javier Hall is a 87 y.o. male.  Patient has past medical history of essential hypertension, paroxysmal atrial tachycardia and history of adrenal tumor in the past.  He denies any problems at this time.  He started complains of this visit.  No chest pain orthopnea or PND.  At the time of my evaluation, the patient is alert awake oriented and in no distress.  Past Medical History:  Diagnosis Date   Alzheimer's dementia [facility])    Arrhythmia    Arthritis    BPH (benign prostatic hyperplasia)    Dementia (HCC)    mild   Essential hypertension 03/11/2015   Familial hemochromatosis (HCC)    Familial hemochromatosis (HCC)    History of kidney stones    Hyperlipidemia    Hypertension    Mild cognitive impairment 10/03/2012    Mixed hyperlipidemia 12/01/2018   Near syncope 03/11/2015   Paroxysmal atrial tachycardia 12/01/2018   PONV (postoperative nausea and vomiting)    Renal malignant tumor, left (HCC) 10/18/2018   S/P total knee replacement 11/19/2015   Skin nodule 04/02/2020   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    Past Surgical History:  Procedure Laterality Date   APPENDECTOMY     BOWEL RESECTION     CATARACT EXTRACTION Bilateral 08/2019   CHOLECYSTECTOMY     COLONOSCOPY     cyst removed from neck     IR RADIOLOGIST EVAL & MGMT  11/14/2018   IR RADIOLOGIST EVAL & MGMT  05/22/2019   IR RADIOLOGIST EVAL & MGMT  12/11/2019   IR RADIOLOGIST EVAL & MGMT  06/03/2020   IR RADIOLOGIST EVAL & MGMT  06/02/2021   IR RADIOLOGIST EVAL & MGMT  06/17/2022   RADIOFREQUENCY ABLATION N/A 10/18/2018   Procedure: MICROWAVE THERMAL ABLATION AND BIOPSY;  Surgeon: Javier Moan, MD;  Location: [facility];  Service: Anesthesiology;  Laterality: N/A;   SKIN LESION EXCISION     multiple   TOTAL KNEE ARTHROPLASTY Left 11/19/2015   Procedure: TOTAL KNEE ARTHROPLASTY;  Surgeon: Javier Ave, MD;  [facility];  Service: Orthopedics;  Laterality: Left;    Current Medications: Current Meds  Medication Sig   amLODipine (NORVASC) 5 MG tablet Take 5 mg by mouth daily.   aspirin EC 81 MG tablet Take 81 mg by  mouth daily.   donepezil (ARICEPT) 10 MG tablet Take 10 mg by mouth daily.   finasteride (PROSCAR) 5 MG tablet Take 5 mg by mouth daily.   lisinopril (ZESTRIL) 10 MG tablet Take 10 mg by mouth daily.   loratadine (CLARITIN) 10 MG tablet Take 10 mg by mouth daily.   meclizine (ANTIVERT) 25 MG tablet Take 25 mg by mouth 3 (three) times daily as needed for dizziness.   memantine (NAMENDA) 10 MG tablet Take 10 mg by mouth 2 (two) times daily.   Multiple Vitamin (MULTIVITAMIN WITH MINERALS) TABS tablet Take 1 tablet by mouth daily.   tamsulosin (FLOMAX) 0.4 MG CAPS capsule Take 0.4 mg by mouth daily.     Allergies:    Hydrocodone-acetaminophen   Social History   Socioeconomic History   Marital status: Married    Spouse name: Not on file   Number of children: Not on file   Years of education: Not on file   Highest education level: Not on file  Occupational History   Not on file  Tobacco Use   Smoking status: Never   Smokeless tobacco: Never  Vaping Use   Vaping status: Never Used  Substance and Sexual Activity   Alcohol use: No   Drug use: No   Sexual activity: Not Currently  Other Topics Concern   Not on file  Social History Narrative   Not on file   Social Determinants of Health   Financial Resource Strain: Not on file  Food Insecurity: Not on file  Transportation Needs: Not on file  Physical Activity: Not on file  Stress: Not on file  Social Connections: Not on file     Family History: The patient's family history includes Alcohol abuse in his brother; Aneurysm in his father; Breast cancer in his mother and sister; Diabetes in his father; Hypertension in his father.  ROS:   Please see the history of present illness.    All other systems reviewed and are negative.  EKGs/Labs/Other Studies Reviewed:    The following studies were reviewed today: I discussed my findings with the patient at length   Recent Labs: No results found for requested labs within last 365 days.  Recent Lipid Panel No results found for: "CHOL", "TRIG", "HDL", "CHOLHDL", "VLDL", "LDLCALC", "LDLDIRECT"  Physical Exam:    VS:  BP 134/64   Pulse 61   Ht 5\' 11"  (1.803 m)   Wt 193 lb 3.2 oz (87.6 kg)   SpO2 95%   BMI 26.95 kg/m     Wt Readings from Last 3 Encounters:  08/25/22 193 lb 3.2 oz (87.6 kg)  08/18/21 195 lb 12.8 oz (88.8 kg)  11/06/20 189 lb 9.6 oz (86 kg)     GEN: Patient is in no acute distress HEENT: Normal NECK: No JVD; No carotid bruits LYMPHATICS: No lymphadenopathy CARDIAC: Hear sounds regular, 2/6 systolic murmur at the apex. RESPIRATORY:  Clear to auscultation without  rales, wheezing or rhonchi  ABDOMEN: Soft, non-tender, non-distended MUSCULOSKELETAL:  No edema; No deformity  SKIN: Warm and dry NEUROLOGIC:  Alert and oriented x 3 PSYCHIATRIC:  Normal affect   Signed, Javier Brothers, MD  08/25/2022 3:47 PM    [facility]

## 2022-08-25 NOTE — Patient Instructions (Signed)
Medication Instructions:  Your physician recommends that you continue on your current medications as directed. Please refer to the Current Medication list given to you today.  *If you need a refill on your cardiac medications before your next appointment, please call your pharmacy*   Lab Work: None If you have labs (blood work) drawn today and your tests are completely normal, you will receive your results only by: MyChart Message (if you have MyChart) OR A paper copy in the mail If you have any lab test that is abnormal or we need to change your treatment, we will call you to review the results.   Testing/Procedures: None   Follow-Up: At [facility], you and your health needs are our priority.  As part of our continuing mission to provide you with exceptional heart care, we have created designated Provider Care Teams.  These Care Teams include your primary Cardiologist (physician) and Advanced Practice Providers (APPs -  Physician Assistants and Nurse Practitioners) who all work together to provide you with the care you need, when you need it.  We recommend signing up for the patient portal called "MyChart".  Sign up information is provided on this After Visit Summary.  MyChart is used to connect with patients for Virtual Visits (Telemedicine).  Patients are able to view lab/test results, encounter notes, upcoming appointments, etc.  Non-urgent messages can be sent to your provider as well.   To learn more about what you can do with MyChart, go to [email or web address].    Your next appointment:   1 year(s)  Provider:   Rajan Revankar, MD    Other Instructions None  

## 2022-09-21 DIAGNOSIS — L578 Other skin changes due to chronic exposure to nonionizing radiation: Secondary | ICD-10-CM | POA: Diagnosis not present

## 2022-09-21 DIAGNOSIS — L821 Other seborrheic keratosis: Secondary | ICD-10-CM | POA: Diagnosis not present

## 2022-09-21 DIAGNOSIS — L02212 Cutaneous abscess of back [any part, except buttock]: Secondary | ICD-10-CM | POA: Diagnosis not present

## 2022-09-21 DIAGNOSIS — D225 Melanocytic nevi of trunk: Secondary | ICD-10-CM | POA: Diagnosis not present

## 2022-11-01 DIAGNOSIS — N401 Enlarged prostate with lower urinary tract symptoms: Secondary | ICD-10-CM | POA: Diagnosis not present

## 2022-11-01 DIAGNOSIS — R972 Elevated prostate specific antigen [PSA]: Secondary | ICD-10-CM | POA: Diagnosis not present

## 2022-12-02 DIAGNOSIS — Z85528 Personal history of other malignant neoplasm of kidney: Secondary | ICD-10-CM | POA: Diagnosis not present

## 2022-12-02 DIAGNOSIS — N400 Enlarged prostate without lower urinary tract symptoms: Secondary | ICD-10-CM | POA: Diagnosis not present

## 2022-12-02 DIAGNOSIS — Z66 Do not resuscitate: Secondary | ICD-10-CM | POA: Diagnosis not present

## 2022-12-02 DIAGNOSIS — F0280 Dementia in other diseases classified elsewhere without behavioral disturbance: Secondary | ICD-10-CM | POA: Diagnosis not present

## 2022-12-02 DIAGNOSIS — G309 Alzheimer's disease, unspecified: Secondary | ICD-10-CM | POA: Diagnosis not present

## 2022-12-02 DIAGNOSIS — D692 Other nonthrombocytopenic purpura: Secondary | ICD-10-CM | POA: Diagnosis not present

## 2022-12-02 DIAGNOSIS — Z6827 Body mass index (BMI) 27.0-27.9, adult: Secondary | ICD-10-CM | POA: Diagnosis not present

## 2022-12-02 DIAGNOSIS — Z23 Encounter for immunization: Secondary | ICD-10-CM | POA: Diagnosis not present

## 2022-12-02 DIAGNOSIS — I10 Essential (primary) hypertension: Secondary | ICD-10-CM | POA: Diagnosis not present

## 2023-01-04 DIAGNOSIS — Z79899 Other long term (current) drug therapy: Secondary | ICD-10-CM | POA: Diagnosis not present

## 2023-01-04 DIAGNOSIS — R001 Bradycardia, unspecified: Secondary | ICD-10-CM | POA: Diagnosis not present

## 2023-01-04 DIAGNOSIS — S0990XA Unspecified injury of head, initial encounter: Secondary | ICD-10-CM | POA: Diagnosis not present

## 2023-01-04 DIAGNOSIS — R42 Dizziness and giddiness: Secondary | ICD-10-CM | POA: Diagnosis not present

## 2023-01-04 DIAGNOSIS — I10 Essential (primary) hypertension: Secondary | ICD-10-CM | POA: Diagnosis not present

## 2023-01-04 DIAGNOSIS — R0602 Shortness of breath: Secondary | ICD-10-CM | POA: Diagnosis not present

## 2023-01-04 DIAGNOSIS — S199XXA Unspecified injury of neck, initial encounter: Secondary | ICD-10-CM | POA: Diagnosis not present

## 2023-01-04 DIAGNOSIS — S8992XA Unspecified injury of left lower leg, initial encounter: Secondary | ICD-10-CM | POA: Diagnosis not present

## 2023-01-26 DIAGNOSIS — R42 Dizziness and giddiness: Secondary | ICD-10-CM | POA: Diagnosis not present

## 2023-01-26 DIAGNOSIS — Z6827 Body mass index (BMI) 27.0-27.9, adult: Secondary | ICD-10-CM | POA: Diagnosis not present

## 2023-04-07 DIAGNOSIS — Z66 Do not resuscitate: Secondary | ICD-10-CM | POA: Diagnosis not present

## 2023-04-07 DIAGNOSIS — Z85528 Personal history of other malignant neoplasm of kidney: Secondary | ICD-10-CM | POA: Diagnosis not present

## 2023-04-07 DIAGNOSIS — G309 Alzheimer's disease, unspecified: Secondary | ICD-10-CM | POA: Diagnosis not present

## 2023-04-07 DIAGNOSIS — D692 Other nonthrombocytopenic purpura: Secondary | ICD-10-CM | POA: Diagnosis not present

## 2023-04-07 DIAGNOSIS — F0280 Dementia in other diseases classified elsewhere without behavioral disturbance: Secondary | ICD-10-CM | POA: Diagnosis not present

## 2023-04-07 DIAGNOSIS — I10 Essential (primary) hypertension: Secondary | ICD-10-CM | POA: Diagnosis not present

## 2023-04-07 DIAGNOSIS — N400 Enlarged prostate without lower urinary tract symptoms: Secondary | ICD-10-CM | POA: Diagnosis not present

## 2023-04-07 DIAGNOSIS — Z6827 Body mass index (BMI) 27.0-27.9, adult: Secondary | ICD-10-CM | POA: Diagnosis not present

## 2023-05-04 DIAGNOSIS — R972 Elevated prostate specific antigen [PSA]: Secondary | ICD-10-CM | POA: Diagnosis not present

## 2023-05-04 DIAGNOSIS — N2889 Other specified disorders of kidney and ureter: Secondary | ICD-10-CM | POA: Diagnosis not present

## 2023-05-04 DIAGNOSIS — N401 Enlarged prostate with lower urinary tract symptoms: Secondary | ICD-10-CM | POA: Diagnosis not present

## 2023-08-04 DIAGNOSIS — Z66 Do not resuscitate: Secondary | ICD-10-CM | POA: Diagnosis not present

## 2023-08-04 DIAGNOSIS — E785 Hyperlipidemia, unspecified: Secondary | ICD-10-CM | POA: Diagnosis not present

## 2023-08-04 DIAGNOSIS — H6123 Impacted cerumen, bilateral: Secondary | ICD-10-CM | POA: Diagnosis not present

## 2023-08-04 DIAGNOSIS — Z79899 Other long term (current) drug therapy: Secondary | ICD-10-CM | POA: Diagnosis not present

## 2023-08-04 DIAGNOSIS — Z6827 Body mass index (BMI) 27.0-27.9, adult: Secondary | ICD-10-CM | POA: Diagnosis not present

## 2023-08-04 DIAGNOSIS — G309 Alzheimer's disease, unspecified: Secondary | ICD-10-CM | POA: Diagnosis not present

## 2023-08-04 DIAGNOSIS — F0280 Dementia in other diseases classified elsewhere without behavioral disturbance: Secondary | ICD-10-CM | POA: Diagnosis not present

## 2023-08-04 DIAGNOSIS — Z85528 Personal history of other malignant neoplasm of kidney: Secondary | ICD-10-CM | POA: Diagnosis not present

## 2023-08-04 DIAGNOSIS — I10 Essential (primary) hypertension: Secondary | ICD-10-CM | POA: Diagnosis not present

## 2023-08-04 DIAGNOSIS — D692 Other nonthrombocytopenic purpura: Secondary | ICD-10-CM | POA: Diagnosis not present

## 2023-08-04 DIAGNOSIS — N400 Enlarged prostate without lower urinary tract symptoms: Secondary | ICD-10-CM | POA: Diagnosis not present

## 2023-08-31 ENCOUNTER — Ambulatory Visit: Attending: Cardiology | Admitting: Cardiology

## 2023-09-06 DIAGNOSIS — H524 Presbyopia: Secondary | ICD-10-CM | POA: Diagnosis not present

## 2023-09-06 DIAGNOSIS — Z961 Presence of intraocular lens: Secondary | ICD-10-CM | POA: Diagnosis not present

## 2023-09-06 DIAGNOSIS — H43393 Other vitreous opacities, bilateral: Secondary | ICD-10-CM | POA: Diagnosis not present

## 2023-09-06 DIAGNOSIS — H26493 Other secondary cataract, bilateral: Secondary | ICD-10-CM | POA: Diagnosis not present

## 2023-09-06 DIAGNOSIS — H52223 Regular astigmatism, bilateral: Secondary | ICD-10-CM | POA: Diagnosis not present

## 2023-09-06 DIAGNOSIS — H5231 Anisometropia: Secondary | ICD-10-CM | POA: Diagnosis not present

## 2023-09-08 ENCOUNTER — Encounter: Payer: Self-pay | Admitting: Cardiology

## 2023-09-08 ENCOUNTER — Ambulatory Visit: Attending: Cardiology | Admitting: Cardiology

## 2023-09-08 VITALS — BP 134/60 | HR 62 | Ht 71.0 in | Wt 194.6 lb

## 2023-09-08 DIAGNOSIS — I4719 Other supraventricular tachycardia: Secondary | ICD-10-CM

## 2023-09-08 DIAGNOSIS — I10 Essential (primary) hypertension: Secondary | ICD-10-CM | POA: Diagnosis not present

## 2023-09-08 DIAGNOSIS — E782 Mixed hyperlipidemia: Secondary | ICD-10-CM | POA: Diagnosis not present

## 2023-09-08 NOTE — Progress Notes (Signed)
 Cardiology Office Note:    Date:  09/08/2023   ID:  Javier Hall, DOB Oct 07, 1935, MRN [id number]  PCP:  Elaine Garnette BIRCH., MD  Cardiologist:  Jennifer JONELLE Crape, MD   Referring MD: Elaine Garnette BIRCH., MD    ASSESSMENT:    1. Essential hypertension   2. Paroxysmal atrial tachycardia (HCC)   3. Mixed hyperlipidemia    PLAN:    In order of problems listed above:  Primary prevention stressed with the patient.  Importance of compliance with diet medication stressed and patient verbalized standing. Advised to walk at least half an hour a day on a daily basis to the best of his ability. Essential hypertension: Blood pressure is stable and diet was emphasized.  Salt intake issues, lifestyle modification urged. Mixed dyslipidemia: On lipid-lowering medications followed by primary care.  Stable lipids.  Diet emphasized. Paroxysmal atrial tachycardia: Stable and asymptomatic.  Medical management.  Will continue to monitor. Patient will be seen in follow-up appointment in 6 months or earlier if the patient has any concerns.    Medication Adjustments/Labs and Tests Ordered: Current medicines are reviewed at length with the patient today.  Concerns regarding medicines are outlined above.  Orders Placed This Encounter  Procedures   EKG 12-Lead   No orders of the defined types were placed in this encounter.    No chief complaint on file.    History of Present Illness:    Javier Hall is a 88 y.o. male.  Patient has past medical history of essential hypertension, mixed dyslipidemia, paroxysmal atrial tachycardia.  He denies any problems at this time and takes care of activities of daily living.  No chest pain orthopnea or PND.  He is stable with his gait but uses a walker.  He had a fall in December fortunately did not do much back pain.  His daughter accompanies him for this visit.  No chest pain orthopnea PND syncope or palpitations.  At the time of my evaluation,  the patient is alert awake oriented and in no distress.  Past Medical History:  Diagnosis Date   Alzheimer's dementia (HCC)    Arrhythmia    Arthritis    BPH (benign prostatic hyperplasia)    Dementia (HCC)    mild   Essential hypertension 03/11/2015   Familial hemochromatosis (HCC)    Familial hemochromatosis (HCC)    History of kidney stones    Hyperlipidemia    Hypertension    Mild cognitive impairment 10/03/2012   Mixed hyperlipidemia 12/01/2018   Near syncope 03/11/2015   Paroxysmal atrial tachycardia (HCC) 12/01/2018   PONV (postoperative nausea and vomiting)    Renal malignant tumor, left (HCC) 10/18/2018   S/P total knee replacement 11/19/2015   Skin nodule 04/02/2020   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    Past Surgical History:  Procedure Laterality Date   APPENDECTOMY     BOWEL RESECTION     CATARACT EXTRACTION Bilateral 08/2019   CHOLECYSTECTOMY     COLONOSCOPY     cyst removed from neck     IR RADIOLOGIST EVAL & MGMT  11/14/2018   IR RADIOLOGIST EVAL & MGMT  05/22/2019   IR RADIOLOGIST EVAL & MGMT  12/11/2019   IR RADIOLOGIST EVAL & MGMT  06/03/2020   IR RADIOLOGIST EVAL & MGMT  06/02/2021   IR RADIOLOGIST EVAL & MGMT  06/17/2022   RADIOFREQUENCY ABLATION N/A 10/18/2018   Procedure: MICROWAVE THERMAL ABLATION AND BIOPSY;  Surgeon: Karalee Beat, MD;  Location: [facility]S;  Service:  Anesthesiology;  Laterality: N/A;   SKIN LESION EXCISION     multiple   TOTAL KNEE ARTHROPLASTY Left 11/19/2015   Procedure: TOTAL KNEE ARTHROPLASTY;  Surgeon: Toribio JULIANNA Chancy, MD;  Location: [facility] OR;  Service: Orthopedics;  Laterality: Left;    Current Medications: Current Meds  Medication Sig   aspirin  EC 81 MG tablet Take 81 mg by mouth daily.   donepezil  (ARICEPT ) 10 MG tablet Take 10 mg by mouth daily.   finasteride  (PROSCAR ) 5 MG tablet Take 5 mg by mouth daily.   lisinopril  (ZESTRIL ) 10 MG tablet Take 10 mg by mouth daily.   meclizine (ANTIVERT) 25 MG tablet Take 25 mg by mouth 3  (three) times daily as needed for dizziness.   memantine  (NAMENDA ) 10 MG tablet Take 10 mg by mouth 2 (two) times daily.   Multiple Vitamin (MULTIVITAMIN WITH MINERALS) TABS tablet Take 1 tablet by mouth daily.   rosuvastatin (CRESTOR) 5 MG tablet Take 5 mg by mouth once a week.   tamsulosin  (FLOMAX ) 0.4 MG CAPS capsule Take 0.4 mg by mouth daily.     Allergies:   Hydrocodone -acetaminophen    Social History   Socioeconomic History   Marital status: Married    Spouse name: Not on file   Number of children: Not on file   Years of education: Not on file   Highest education level: Not on file  Occupational History   Not on file  Tobacco Use   Smoking status: Never   Smokeless tobacco: Never  Vaping Use   Vaping status: Never Used  Substance and Sexual Activity   Alcohol use: No   Drug use: No   Sexual activity: Not Currently  Other Topics Concern   Not on file  Social History Narrative   Not on file   Social Drivers of Health   Financial Resource Strain: Not on file  Food Insecurity: Not on file  Transportation Needs: Not on file  Physical Activity: Not on file  Stress: Not on file  Social Connections: Not on file     Family History: The patient's family history includes Alcohol abuse in his brother; Aneurysm in his father; Breast cancer in his mother and sister; Diabetes in his father; Hypertension in his father.  ROS:   Please see the history of present illness.    All other systems reviewed and are negative.  EKGs/Labs/Other Studies Reviewed:    The following studies were reviewed today: .SABRAEKG Interpretation Date/Time:  Thursday September 08 2023 14:08:21 EDT Ventricular Rate:  62 PR Interval:  184 QRS Duration:  86 QT Interval:  394 QTC Calculation: 399 R Axis:   63  Text Interpretation: Normal sinus rhythm Normal ECG When compared with ECG of 25-Aug-2022 15:12, No significant change was found Confirmed by Edwyna Backers [phone]) on 09/08/2023 2:18:56 PM      Recent Labs: No results found for requested labs within last 365 days.  Recent Lipid Panel No results found for: CHOL, TRIG, HDL, CHOLHDL, VLDL, LDLCALC, LDLDIRECT  Physical Exam:    VS:  BP 134/60   Pulse 62   Ht 5' 11 (1.803 m)   Wt 194 lb 9.6 oz (88.3 kg)   SpO2 95%   BMI 27.14 kg/m     Wt Readings from Last 3 Encounters:  09/08/23 194 lb 9.6 oz (88.3 kg)  08/25/22 193 lb 3.2 oz (87.6 kg)  08/18/21 195 lb 12.8 oz (88.8 kg)     GEN: Patient is in no acute distress HEENT:  Normal NECK: No JVD; No carotid bruits LYMPHATICS: No lymphadenopathy CARDIAC: Hear sounds regular, 2/6 systolic murmur at the apex. RESPIRATORY:  Clear to auscultation without rales, wheezing or rhonchi  ABDOMEN: Soft, non-tender, non-distended MUSCULOSKELETAL:  No edema; No deformity  SKIN: Warm and dry NEUROLOGIC:  Alert and oriented x 3 PSYCHIATRIC:  Normal affect   Signed, Jennifer JONELLE Crape, MD  09/08/2023 2:28 PM    [facility]

## 2023-09-08 NOTE — Patient Instructions (Signed)
 Medication Instructions:  Your physician recommends that you continue on your current medications as directed. Please refer to the Current Medication list given to you today.  *If you need a refill on your cardiac medications before your next appointment, please call your pharmacy*  Lab Work: NONE If you have labs (blood work) drawn today and your tests are completely normal, you will receive your results only by: MyChart Message (if you have MyChart) OR A paper copy in the mail If you have any lab test that is abnormal or we need to change your treatment, we will call you to review the results.  Testing/Procedures: NONE  Follow-Up: At [facility], you and your health needs are our priority.  As part of our continuing mission to provide you with exceptional heart care, our providers are all part of one team.  This team includes your primary Cardiologist (physician) and Advanced Practice Providers or APPs (Physician Assistants and Nurse Practitioners) who all work together to provide you with the care you need, when you need it.  Your next appointment:   1 year(s)  Provider:   Jennifer Crape, MD    We recommend signing up for the patient portal called MyChart.  Sign up information is provided on this After Visit Summary.  MyChart is used to connect with patients for Virtual Visits (Telemedicine).  Patients are able to view lab/test results, encounter notes, upcoming appointments, etc.  Non-urgent messages can be sent to your provider as well.   To learn more about what you can do with MyChart, go to [email or web address].   Other Instructions

## 2023-09-20 DIAGNOSIS — J019 Acute sinusitis, unspecified: Secondary | ICD-10-CM | POA: Diagnosis not present

## 2023-09-20 DIAGNOSIS — R051 Acute cough: Secondary | ICD-10-CM | POA: Diagnosis not present

## 2023-09-20 DIAGNOSIS — H6123 Impacted cerumen, bilateral: Secondary | ICD-10-CM | POA: Diagnosis not present

## 2023-09-20 DIAGNOSIS — R0981 Nasal congestion: Secondary | ICD-10-CM | POA: Diagnosis not present

## 2023-11-02 ENCOUNTER — Other Ambulatory Visit: Payer: Self-pay | Admitting: Interventional Radiology

## 2023-11-02 DIAGNOSIS — N2889 Other specified disorders of kidney and ureter: Secondary | ICD-10-CM

## 2023-11-03 DIAGNOSIS — N401 Enlarged prostate with lower urinary tract symptoms: Secondary | ICD-10-CM | POA: Diagnosis not present

## 2023-11-03 DIAGNOSIS — R972 Elevated prostate specific antigen [PSA]: Secondary | ICD-10-CM | POA: Diagnosis not present

## 2023-11-03 DIAGNOSIS — N2889 Other specified disorders of kidney and ureter: Secondary | ICD-10-CM | POA: Diagnosis not present

## 2023-11-09 DIAGNOSIS — N2889 Other specified disorders of kidney and ureter: Secondary | ICD-10-CM | POA: Diagnosis not present

## 2023-11-10 DIAGNOSIS — N2889 Other specified disorders of kidney and ureter: Secondary | ICD-10-CM | POA: Diagnosis not present

## 2023-11-25 ENCOUNTER — Other Ambulatory Visit: Payer: Self-pay | Admitting: Interventional Radiology

## 2023-11-25 DIAGNOSIS — N2889 Other specified disorders of kidney and ureter: Secondary | ICD-10-CM

## 2023-12-07 DIAGNOSIS — Z85528 Personal history of other malignant neoplasm of kidney: Secondary | ICD-10-CM | POA: Diagnosis not present

## 2023-12-07 DIAGNOSIS — I10 Essential (primary) hypertension: Secondary | ICD-10-CM | POA: Diagnosis not present

## 2023-12-07 DIAGNOSIS — G309 Alzheimer's disease, unspecified: Secondary | ICD-10-CM | POA: Diagnosis not present

## 2023-12-07 DIAGNOSIS — N400 Enlarged prostate without lower urinary tract symptoms: Secondary | ICD-10-CM | POA: Diagnosis not present

## 2023-12-07 DIAGNOSIS — F0280 Dementia in other diseases classified elsewhere without behavioral disturbance: Secondary | ICD-10-CM | POA: Diagnosis not present

## 2023-12-07 DIAGNOSIS — Z23 Encounter for immunization: Secondary | ICD-10-CM | POA: Diagnosis not present

## 2023-12-07 DIAGNOSIS — Z6827 Body mass index (BMI) 27.0-27.9, adult: Secondary | ICD-10-CM | POA: Diagnosis not present

## 2023-12-07 DIAGNOSIS — E785 Hyperlipidemia, unspecified: Secondary | ICD-10-CM | POA: Diagnosis not present

## 2023-12-07 DIAGNOSIS — D692 Other nonthrombocytopenic purpura: Secondary | ICD-10-CM | POA: Diagnosis not present

## 2023-12-07 DIAGNOSIS — Z66 Do not resuscitate: Secondary | ICD-10-CM | POA: Diagnosis not present

## 2023-12-07 DIAGNOSIS — Z973 Presence of spectacles and contact lenses: Secondary | ICD-10-CM | POA: Diagnosis not present

## 2023-12-13 ENCOUNTER — Telehealth

## 2023-12-15 ENCOUNTER — Telehealth

## 2023-12-20 ENCOUNTER — Inpatient Hospital Stay
Admission: RE | Admit: 2023-12-20 | Discharge: 2023-12-20 | Attending: Interventional Radiology | Admitting: Interventional Radiology

## 2023-12-20 DIAGNOSIS — N2889 Other specified disorders of kidney and ureter: Secondary | ICD-10-CM

## 2023-12-20 HISTORY — PX: IR RADIOLOGIST EVAL & MGMT: IMG5224

## 2023-12-20 NOTE — Progress Notes (Addendum)
 Chief Complaint: Patient was consulted remotely today (TeleHealth) for eft chromophobe RCC  at the request of [name]    Referring Physician(s): Meria Crilly K  History of Present Illness: Javier Hall is a 88 y.o. male who initially presented at the kind request of Dr. [name] to discuss minimally invasion of the options for treatment of a solid left renal mass. [name] was recently admitted with a urinary tract infection. Imaging demonstrated a small but enhancing solid mass arising from the interpolar left kidney. A follow-up MRI of the abdomen was performed confirming findings highly suspicious for a renal cell carcinoma. The mass measures 1.5 x 1.4 cm.  [name] underwent concomitant CT-guided biopsy and percutaneous thermal ablation of the renal lesion on 10/18/2018.  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   His postprocedural follow-up was uneventful and he recovered fully.  We are teleconferencing today to review his 2.5 year follow-up with surveillance MRI imaging.   MRI 05/15/2021:  1. Stable appearing ablation site at the posterior lower pole left kidney. No evidence of recurrent or metastatic disease.   2. Interval development of multiple intra and extrahepatic biliary ductal filling defects consistent with choledocholithiasis.  Associated new mild intrahepatic biliary ductal dilatation. Recommend GI consultation/ERCP.    MRI 05/19/22 Status post left lower pole renal ablation. No findings suspicious for recurrent or metastatic disease.   MRI 11/10/2023 - New left para-aortic enhancing mass likely lymph node. Recommend CT of the abdomen and pelvis with IV and oral contrast to further evaluate. - No definitive residual or recurrent renal mass.    He is otherwise doing well.  No current clinical issues.  Denies hematuria, blood in stool, issues with urination. Routine physical and lab work all WNL.  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/19/2015   Skin nodule 04/02/2020   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    Past Surgical History:  Procedure Laterality Date   APPENDECTOMY     BOWEL RESECTION     CATARACT EXTRACTION Bilateral 08/2019   CHOLECYSTECTOMY     COLONOSCOPY     cyst removed from neck     IR RADIOLOGIST EVAL & MGMT  11/14/2018   IR RADIOLOGIST EVAL & MGMT  05/22/2019   IR RADIOLOGIST EVAL & MGMT  12/11/2019   IR RADIOLOGIST EVAL & MGMT  06/03/2020   IR RADIOLOGIST EVAL & MGMT  06/02/2021   IR RADIOLOGIST EVAL & MGMT  06/17/2022   RADIOFREQUENCY ABLATION N/A 10/18/2018   Procedure: MICROWAVE THERMAL ABLATION AND BIOPSY;  Surgeon: Karalee Beat, MD;  Location: [facility];  Service: Anesthesiology;  Laterality: N/A;   SKIN LESION EXCISION     multiple   TOTAL KNEE ARTHROPLASTY Left 11/19/2015   Procedure: TOTAL KNEE ARTHROPLASTY;  Surgeon: Toribio JULIANNA Chancy, MD;  Location: [facility] OR;  Service: Orthopedics;  Laterality: Left;    Allergies: Hydrocodone -acetaminophen   Medications:  Prior to Admission medications   Medication Sig Start Date End Date Taking? Authorizing Provider  aspirin  EC 81 MG tablet Take 81 mg by mouth daily.    [provider]  donepezil  (ARICEPT ) 10 MG tablet Take 10 mg by mouth daily.    [provider]  finasteride  (PROSCAR ) 5 MG tablet Take 5 mg by mouth daily.    [provider]  lisinopril  (ZESTRIL ) 10  MG tablet Take 10 mg by mouth daily. 03/31/22   [provider]  meclizine (ANTIVERT) 25 MG tablet Take 25 mg by mouth 3 (three) times daily as needed for dizziness.    [provider]  memantine  (NAMENDA ) 10 MG tablet Take 10 mg by mouth 2 (two) times daily.    [provider]  Multiple Vitamin (MULTIVITAMIN WITH MINERALS) TABS tablet Take 1 tablet by mouth daily.    [provider]  rosuvastatin (CRESTOR) 5 MG tablet Take 5 mg by mouth once a week. 08/05/23   [provider]  tamsulosin  (FLOMAX ) 0.4 MG CAPS capsule Take 0.4 mg by mouth daily.    [provider]     Family History  Problem Relation Age of Onset   Breast cancer Mother    Hypertension Father    Diabetes Father    Aneurysm Father    Breast cancer Sister    Alcohol abuse Brother     Social History   Socioeconomic History   Marital status: Married    Spouse name: Not on file   Number of children: Not on file   Years of education: Not on file   Highest education level: Not on file  Occupational History   Not on file  Tobacco Use   Smoking status: Never   Smokeless tobacco: Never  Vaping Use   Vaping status: Never Used  Substance and Sexual Activity   Alcohol use: No   Drug use: No   Sexual activity: Not Currently  Other Topics Concern   Not on file  Social History Narrative   Not on file   Social Drivers of Health   Financial Resource Strain: Not on file  Food Insecurity: Not on file  Transportation Needs: Not on file  Physical Activity: Not on file  Stress: Not on file  Social Connections: Not on file    Review of Systems  Review of Systems: A 12 point ROS discussed and pertinent positives are indicated in the HPI above.  All other systems are negative.  Physical Exam No direct physical exam was performed (except for noted visual exam findings with Video Visits).    Vital Signs: There were no vitals taken for this visit.  Imaging: No  results found.  Labs:  CBC: No results for input(s): WBC, HGB, HCT, PLT in the last 8760 hours.  COAGS: No results for input(s): INR, APTT in the last 8760 hours.  BMP: No results for input(s): NA, K, CL, CO2, GLUCOSE, BUN, CALCIUM, CREATININE, GFRNONAA, GFRAA in the last 8760 hours.  Invalid input(s): CMP  LIVER FUNCTION TESTS: No results for input(s): BILITOT, AST, ALT, ALKPHOS, PROT, ALBUMIN in the last 8760 hours.  TUMOR MARKERS: No results for input(s): AFPTM, CEA, CA199, CHROMGRNA in the last 8760 hours.  Assessment and Plan:   Extremely pleasant 88 year old gentleman doing very well 5 years status post percutaneous microwave ablation of left lower pole renal cell carcinoma.  While his primary site remains recurrence free, there is a new concerning lymph node in the left para-aortic space  inferior to the kidney.  We need further imaging to identify the issue.      1.  CT scan abd/pelvis WITH at [facility] - evaluate left para-aortic lymph node, hx left renal cancer 2.) F/U phone call once CT is completed and resulted    Electronically Signed: Wilkie MARLA Lent 12/20/2023, 2:13 PM   I spent a total of  15 Minutes in remote  clinical consultation, greater than 50% of which was counseling/coordinating care for left chromophobe RCC .    Visit type: Audio only (telephone). Audio (no video) only due to patient's lack of internet/smartphone capability. Alternative for in-person consultation at [facility], [address] [place], [place], [place]. This visit type was conducted due to national recommendations for restrictions regarding the COVID-19 Pandemic (e.g. social distancing).  This format is felt to be most appropriate for this patient at this time.  All issues noted in this document were discussed and addressed.

## 2023-12-21 ENCOUNTER — Other Ambulatory Visit: Payer: Self-pay | Admitting: Interventional Radiology

## 2023-12-21 DIAGNOSIS — N289 Disorder of kidney and ureter, unspecified: Secondary | ICD-10-CM

## 2024-01-17 ENCOUNTER — Other Ambulatory Visit: Payer: Self-pay | Admitting: Interventional Radiology

## 2024-01-17 DIAGNOSIS — N2889 Other specified disorders of kidney and ureter: Secondary | ICD-10-CM

## 2024-01-19 ENCOUNTER — Telehealth (HOSPITAL_COMMUNITY): Payer: Self-pay | Admitting: Interventional Radiology

## 2024-01-19 ENCOUNTER — Ambulatory Visit
Admission: RE | Admit: 2024-01-19 | Discharge: 2024-01-19 | Disposition: A | Discharge status: Home / Self Care | Source: Ambulatory Visit | Attending: Interventional Radiology | Admitting: Interventional Radiology

## 2024-01-19 DIAGNOSIS — N2889 Other specified disorders of kidney and ureter: Secondary | ICD-10-CM

## 2024-01-19 HISTORY — PX: IR RADIOLOGIST EVAL & MGMT: IMG5224

## 2024-01-19 NOTE — Telephone Encounter (Signed)
 Interventional Radiology Telephone Note  I spoke with Mr. Javier Hall daughter over the phone about the results of his recent CT scan.  The enlarged LEFT RETROPERITONEAL LN persists and remains concerning.  No obvious malignant source was seen.    Our options include doing nothing given advanced age, PET-CT and CT biopsy.  She tells me that Mr. Javier Hall is doing well and is otherwise healthy and does want to pursue further evaluation.  After some discussion, we feel that biopsy is safe, reasonable and will provide us  with the most definitive answer.   1.) Please schedule for CT Biopsy of LEFT RETROPERITONEAL NODULE at [facility] with moderate sedation.    Signed,  Wilkie LOIS Lent, MD

## 2024-01-25 ENCOUNTER — Other Ambulatory Visit: Payer: Self-pay | Admitting: Interventional Radiology

## 2024-01-25 DIAGNOSIS — D483 Neoplasm of uncertain behavior of retroperitoneum: Secondary | ICD-10-CM

## 2024-02-05 ENCOUNTER — Other Ambulatory Visit: Payer: Self-pay | Admitting: Radiology

## 2024-02-05 DIAGNOSIS — D483 Neoplasm of uncertain behavior of retroperitoneum: Secondary | ICD-10-CM

## 2024-02-09 ENCOUNTER — Other Ambulatory Visit: Payer: Self-pay | Admitting: Radiology

## 2024-02-09 ENCOUNTER — Telehealth: Payer: Self-pay

## 2024-02-09 NOTE — Telephone Encounter (Signed)
 Patient for CT guided LT RP nodule biopsy on Friday 02/10/24, I called and LVM for the patient's daughter, Arland on the phone and gave pre-procedure instructions. VM made patient's daughter, Arland aware to be here at 10a, last dose of ASA 81mg  was 02/04/24, NPO after MN prior to procedure as well as driver post procedure/recovery/discharge. Called 02/09/24  I had spoken with Arland previously and she was aware of these instructions and directions.

## 2024-02-10 ENCOUNTER — Other Ambulatory Visit: Payer: Self-pay

## 2024-02-10 ENCOUNTER — Ambulatory Visit
Admission: RE | Admit: 2024-02-10 | Discharge: 2024-02-10 | Disposition: A | Source: Ambulatory Visit | Attending: Interventional Radiology | Admitting: Interventional Radiology

## 2024-02-10 DIAGNOSIS — C649 Malignant neoplasm of unspecified kidney, except renal pelvis: Secondary | ICD-10-CM | POA: Insufficient documentation

## 2024-02-10 DIAGNOSIS — Z79899 Other long term (current) drug therapy: Secondary | ICD-10-CM | POA: Insufficient documentation

## 2024-02-10 DIAGNOSIS — D483 Neoplasm of uncertain behavior of retroperitoneum: Secondary | ICD-10-CM

## 2024-02-10 DIAGNOSIS — C8290 Follicular lymphoma, unspecified, unspecified site: Secondary | ICD-10-CM | POA: Insufficient documentation

## 2024-02-10 DIAGNOSIS — Z7982 Long term (current) use of aspirin: Secondary | ICD-10-CM | POA: Insufficient documentation

## 2024-02-10 LAB — CBC
HCT: 44.9 % (ref 39.0–52.0)
Hemoglobin: 15.2 g/dL (ref 13.0–17.0)
MCH: 30.6 pg (ref 26.0–34.0)
MCHC: 33.9 g/dL (ref 30.0–36.0)
MCV: 90.5 fL (ref 80.0–100.0)
Platelets: 249 10*3/uL (ref 150–400)
RBC: 4.96 MIL/uL (ref 4.22–5.81)
RDW: 13.5 % (ref 11.5–15.5)
WBC: 8.6 10*3/uL (ref 4.0–10.5)
nRBC: 0 % (ref 0.0–0.2)

## 2024-02-10 LAB — PROTIME-INR
INR: 1 (ref 0.8–1.2)
Prothrombin Time: 13.8 s (ref 11.4–15.2)

## 2024-02-10 MED ORDER — SODIUM CHLORIDE 0.9 % IV SOLN: INTRAVENOUS | Status: DC

## 2024-02-10 MED ORDER — FENTANYL CITRATE (PF) 100 MCG/2ML IJ SOLN
INTRAMUSCULAR | Status: AC | PRN
  Administered 2024-02-10 (×2): 50 ug via INTRAVENOUS

## 2024-02-10 MED ORDER — MIDAZOLAM HCL 2 MG/2ML IJ SOLN
INTRAMUSCULAR | Status: AC
  Filled 2024-02-10: qty 2

## 2024-02-10 MED ORDER — FENTANYL CITRATE (PF) 100 MCG/2ML IJ SOLN
INTRAMUSCULAR | Status: AC
  Filled 2024-02-10: qty 2

## 2024-02-10 MED ORDER — MIDAZOLAM HCL (PF) 2 MG/2ML IJ SOLN
INTRAMUSCULAR | Status: AC | PRN
  Administered 2024-02-10: 1 mg via INTRAVENOUS

## 2024-02-10 NOTE — H&P (Signed)
 "   Chief Complaint: Enlarging Left Retroperitoneal Lymph Node    Procedure: Retroperitoneal Lymph Node Biopsy   Referring Provider(s): Dr. Germain Brothers  Supervising Physician: Karalee Beat  Patient Status: [facility] - Out-pt  History of Present Illness: Javier Hall is a 89 y.o. male with a history of chromophobe renal cell carcinoma known to IR from biopsy and renal thermal ablation in 2020 with Dr. VEAR Karalee. He has been following with Dr. Karalee and undergoing routine imaging for surveillance since his ablation. Previous MRI in 10/2023 revealed a no residual/recurrent renal mass, but was significant for new left para-aortic enhancing mass likely a lymph node. Patient was subsequently scheduled for CT A/P  with contrast for further evaluation which also demonstrated enlarged left retroperitoneal lymph node. Imaging reviewed by Dr. Karalee who recommend moving forward with lymph node biopsy at this time.   Patient and his daughter present for procedure today. States that he has been doing well overall and denies any concerns at this time. He denies any recent fevers/chills, abdominal pain, nausea/vomiting, hematuria, dysuria, or flank pain. NPO since midnight. All questions and concerns answered at the bedside.   Patient is Full Code  Past Medical History:  Diagnosis Date   Alzheimer's dementia (HCC)    Arrhythmia    Arthritis    BPH (benign prostatic hyperplasia)    Dementia (HCC)    mild   Essential hypertension 03/11/2015   Familial hemochromatosis    Familial hemochromatosis    History of kidney stones    Hyperlipidemia    Hypertension    Mild cognitive impairment 10/03/2012   Mixed hyperlipidemia 12/01/2018   Near syncope 03/11/2015   Paroxysmal atrial tachycardia 12/01/2018   PONV (postoperative nausea and vomiting)    Renal malignant tumor, left (HCC) 10/18/2018   S/P total knee replacement 11/19/2015   Skin nodule 04/02/2020   Symptomatic PVCs  03/05/2019    Past Surgical History:  Procedure Laterality Date   APPENDECTOMY     BOWEL RESECTION     CATARACT EXTRACTION Bilateral 08/2019   CHOLECYSTECTOMY     COLONOSCOPY     cyst removed from neck     IR RADIOLOGIST EVAL & MGMT  11/14/2018   IR RADIOLOGIST EVAL & MGMT  05/22/2019   IR RADIOLOGIST EVAL & MGMT  12/11/2019   IR RADIOLOGIST EVAL & MGMT  06/03/2020   IR RADIOLOGIST EVAL & MGMT  06/02/2021   IR RADIOLOGIST EVAL & MGMT  06/17/2022   IR RADIOLOGIST EVAL & MGMT  12/20/2023   IR RADIOLOGIST EVAL & MGMT  01/19/2024   RADIOFREQUENCY ABLATION N/A 10/18/2018   Procedure: MICROWAVE THERMAL ABLATION AND BIOPSY;  Surgeon: Karalee Beat, MD;  Location: [facility];  Service: Anesthesiology;  Laterality: N/A;   SKIN LESION EXCISION     multiple   TOTAL KNEE ARTHROPLASTY Left 11/19/2015   Procedure: TOTAL KNEE ARTHROPLASTY;  Surgeon: Toribio JULIANNA Chancy, MD;  Location: [facility] OR;  Service: Orthopedics;  Laterality: Left;    Allergies: Hydrocodone -acetaminophen   Medications: Prior to Admission medications  Medication Sig Start Date End Date Taking? Authorizing Provider  aspirin  EC 81 MG tablet Take 81 mg by mouth daily.    [provider]  donepezil  (ARICEPT ) 10 MG tablet Take 10 mg by mouth daily.    [provider]  finasteride  (PROSCAR ) 5 MG tablet Take 5 mg by mouth daily.    [provider]  lisinopril  (ZESTRIL ) 10 MG tablet Take 10 mg by mouth daily. 03/31/22  [provider]  meclizine (ANTIVERT) 25 MG tablet Take 25 mg by mouth 3 (three) times daily as needed for dizziness.    [provider]  memantine  (NAMENDA ) 10 MG tablet Take 10 mg by mouth 2 (two) times daily.    [provider]  Multiple Vitamin (MULTIVITAMIN WITH MINERALS) TABS tablet Take 1 tablet by mouth daily.    [provider]  rosuvastatin (CRESTOR) 5 MG tablet Take 5 mg by mouth once a week. 08/05/23   [provider]  tamsulosin  (FLOMAX ) 0.4 MG CAPS  capsule Take 0.4 mg by mouth daily.    [provider]     Family History  Problem Relation Age of Onset   Breast cancer Mother    Hypertension Father    Diabetes Father    Aneurysm Father    Breast cancer Sister    Alcohol abuse Brother     Social History   Socioeconomic History   Marital status: Married    Spouse name: Not on file   Number of children: Not on file   Years of education: Not on file   Highest education level: Not on file  Occupational History   Not on file  Tobacco Use   Smoking status: Never   Smokeless tobacco: Never  Vaping Use   Vaping status: Never Used  Substance and Sexual Activity   Alcohol use: No   Drug use: No   Sexual activity: Not Currently  Other Topics Concern   Not on file  Social History Narrative   Not on file   Social Drivers of Health   Tobacco Use: Low Risk (09/08/2023)   Patient History    Smoking Tobacco Use: Never    Smokeless Tobacco Use: Never    Passive Exposure: Not on file  Financial Resource Strain: Not on file  Food Insecurity: Not on file  Transportation Needs: Not on file  Physical Activity: Not on file  Stress: Not on file  Social Connections: Not on file  Depression (EYV7-0): Not on file  Alcohol Screen: Not on file  Housing: Not on file  Utilities: Not on file  Health Literacy: Not on file    Review of Systems Patient denies any headache, chest pain, shortness of breath, abdominal pain, N/V, or fever/chills. All other systems are negative.   Vital Signs: BP (!) 165/72   Pulse (!) 59   Temp 98.1 F (36.7 C) (Oral)   Resp 16   Ht 5' 11 (1.803 m)   Wt 186 lb 9.6 oz (84.6 kg)   SpO2 95%   BMI 26.03 kg/m    Physical Exam Vitals reviewed.  Constitutional:      Appearance: Normal appearance.  HENT:     Mouth/Throat:     Mouth: Mucous membranes are moist.     Pharynx: Oropharynx is clear.  Cardiovascular:     Rate and Rhythm: Normal rate and regular rhythm.     Heart sounds: Normal  heart sounds.  Pulmonary:     Effort: Pulmonary effort is normal.     Breath sounds: Normal breath sounds.  Abdominal:     General: Abdomen is flat.     Palpations: Abdomen is soft.     Tenderness: There is no abdominal tenderness. There is no right CVA tenderness or left CVA tenderness.  Skin:    General: Skin is warm and dry.  Neurological:     Mental Status: He is alert and oriented to person, place, and time.  Psychiatric:  Behavior: Behavior normal.     Imaging: IR Radiologist Eval & Mgmt Result Date: 01/19/2024 EXAM: ESTABLISHED PATIENT OFFICE VISIT CHIEF COMPLAINT: SEE NOTE IN EPIC HISTORY OF PRESENT ILLNESS: SEE NOTE IN EPIC REVIEW OF SYSTEMS: SEE NOTE IN EPIC PHYSICAL EXAMINATION: SEE NOTE IN EPIC ASSESSMENT AND PLAN: SEE NOTE IN EPIC Electronically Signed   By: Wilkie Lent M.D.   On: 01/19/2024 15:52    Labs:  CBC: Recent Labs    02/10/24 1021  WBC 8.6  HGB 15.2  HCT 44.9  PLT 249    COAGS: Recent Labs    02/10/24 1021  INR 1.0    BMP: No results for input(s): NA, K, CL, CO2, GLUCOSE, BUN, CALCIUM, CREATININE, GFRNONAA, GFRAA in the last 8760 hours.  Invalid input(s): CMP  LIVER FUNCTION TESTS: No results for input(s): BILITOT, AST, ALT, ALKPHOS, PROT, ALBUMIN in the last 8760 hours.  TUMOR MARKERS: No results for input(s): AFPTM, CEA, CA199, CHROMGRNA in the last 8760 hours.  Assessment and Plan:  Left chromophobe Renal Cell Carcinoma s/p thermal ablation in 2020 Enlarging Retroperitoneal Lymph Node: Zackerie Sara is a 89 y.o. male with a history of chromophobe renal cell carcinoma s/p thermal ablation in 2020 with Dr. Lent who has been undergoing routine imaging and surveillance for the past several years. Recent imaging notable for enlarging left sided retroperitoneal lymph node. Following discussion with patient, the decision was made to move forward with lymph node biopsy for  diagnostic purposes. Patient [facility] to [facility] Interventional Radiology department for an image-guided retroperitoneal lymph node biopsy with Dr. VEAR Lent. Procedure to be performed under moderate sedation.  -ASA held since 1/24 -INR 1.0 today  -NPO since midnight  Risks and benefits of retroperitoneal lymph node biopsy was discussed with the patient and/or patient's family including, but not limited to bleeding, infection, damage to adjacent structures or low yield requiring additional tests.  All of the questions were answered and there is agreement to proceed.  Consent signed and in chart.   Thank you for allowing our service to participate in Adarius Tigges 's care.    Electronically Signed: Ameenah Prosser M Starla Deller, PA-C   02/10/2024, 10:44 AM     I spent a total of 15 Minutes in face to face in clinical consultation, greater than 50% of which was counseling/coordinating care for retroperitoneal lymph node biopsy. "

## 2024-02-10 NOTE — Procedures (Signed)
 Interventional Radiology Procedure Note  Procedure: CT core biopsy of LEFT retroperitoneal lymph node  Complications: None  Estimated Blood Loss: None  Recommendations: - Bedrest x 1 hr - DC home   Signed,  Wilkie LOIS Lent, MD

## 2024-02-14 LAB — SURGICAL PATHOLOGY

## 2024-02-16 ENCOUNTER — Ambulatory Visit
Admission: RE | Admit: 2024-02-16 | Discharge: 2024-02-16 | Disposition: A | Source: Ambulatory Visit | Attending: Interventional Radiology | Admitting: Interventional Radiology

## 2024-02-16 ENCOUNTER — Other Ambulatory Visit: Payer: Self-pay | Admitting: Interventional Radiology

## 2024-02-16 DIAGNOSIS — D483 Neoplasm of uncertain behavior of retroperitoneum: Secondary | ICD-10-CM

## 2024-02-16 NOTE — Progress Notes (Signed)
 Interventional Radiology Telephone Note  I spoke to Mr. 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  While this is a cancer diagnosis, the low-grade nature is a very reassuring and he may only need to watch things for now.   I explained that I would refer him to Dr. Valaria Kerns who is excellent and local there in [place].  She voiced her understanding.  Signed,  Wilkie LOIS Lent, MD
